# Patient Record
Sex: Male | Born: 1950 | Race: White | Hispanic: No | Smoking: Former smoker
Health system: Southern US, Community
[De-identification: ages and names within clinical notes are randomized; demographics above are authoritative.]

## PROBLEM LIST (undated history)

## (undated) DIAGNOSIS — K219 Gastro-esophageal reflux disease without esophagitis: Secondary | ICD-10-CM

## (undated) DIAGNOSIS — M7542 Impingement syndrome of left shoulder: Secondary | ICD-10-CM

## (undated) DIAGNOSIS — E119 Type 2 diabetes mellitus without complications: Secondary | ICD-10-CM

## (undated) DIAGNOSIS — N529 Male erectile dysfunction, unspecified: Secondary | ICD-10-CM

## (undated) DIAGNOSIS — K589 Irritable bowel syndrome without diarrhea: Secondary | ICD-10-CM

## (undated) DIAGNOSIS — E785 Hyperlipidemia, unspecified: Secondary | ICD-10-CM

## (undated) DIAGNOSIS — I10 Essential (primary) hypertension: Secondary | ICD-10-CM

## (undated) DIAGNOSIS — R200 Anesthesia of skin: Secondary | ICD-10-CM

## (undated) DIAGNOSIS — L280 Lichen simplex chronicus: Secondary | ICD-10-CM

## (undated) HISTORY — PX: COLONOSCOPY: SHX174

## (undated) HISTORY — PX: BACK SURGERY: SHX140

## (undated) HISTORY — PX: APPENDECTOMY: SHX54

## (undated) HISTORY — PX: CHOLECYSTECTOMY: SHX55

---

## 2003-12-29 ENCOUNTER — Ambulatory Visit: Payer: Self-pay | Admitting: Family Medicine

## 2004-08-03 ENCOUNTER — Ambulatory Visit: Payer: Self-pay | Admitting: Unknown Physician Specialty

## 2004-11-03 ENCOUNTER — Ambulatory Visit: Payer: Self-pay | Admitting: Unknown Physician Specialty

## 2007-04-30 ENCOUNTER — Ambulatory Visit: Payer: Self-pay | Admitting: Internal Medicine

## 2007-11-15 ENCOUNTER — Ambulatory Visit: Payer: Self-pay | Admitting: Unknown Physician Specialty

## 2009-01-06 ENCOUNTER — Ambulatory Visit: Payer: Self-pay | Admitting: Family Medicine

## 2010-11-28 ENCOUNTER — Ambulatory Visit: Payer: Self-pay | Admitting: Sports Medicine

## 2013-09-18 DIAGNOSIS — E785 Hyperlipidemia, unspecified: Secondary | ICD-10-CM | POA: Insufficient documentation

## 2013-09-18 DIAGNOSIS — E119 Type 2 diabetes mellitus without complications: Secondary | ICD-10-CM | POA: Insufficient documentation

## 2013-09-18 DIAGNOSIS — R200 Anesthesia of skin: Secondary | ICD-10-CM | POA: Insufficient documentation

## 2013-09-18 DIAGNOSIS — K589 Irritable bowel syndrome without diarrhea: Secondary | ICD-10-CM | POA: Insufficient documentation

## 2013-09-18 DIAGNOSIS — I10 Essential (primary) hypertension: Secondary | ICD-10-CM | POA: Insufficient documentation

## 2014-06-10 DIAGNOSIS — G8929 Other chronic pain: Secondary | ICD-10-CM | POA: Insufficient documentation

## 2014-06-10 DIAGNOSIS — M25512 Pain in left shoulder: Secondary | ICD-10-CM

## 2014-09-15 DIAGNOSIS — M7542 Impingement syndrome of left shoulder: Secondary | ICD-10-CM | POA: Insufficient documentation

## 2015-10-12 ENCOUNTER — Other Ambulatory Visit: Payer: Self-pay | Admitting: Gastroenterology

## 2015-10-12 DIAGNOSIS — R1312 Dysphagia, oropharyngeal phase: Secondary | ICD-10-CM

## 2015-10-13 ENCOUNTER — Other Ambulatory Visit
Admission: RE | Admit: 2015-10-13 | Discharge: 2015-10-13 | Disposition: A | Discharge status: Home / Self Care | Payer: BC Managed Care – PPO | Source: Ambulatory Visit | Attending: Gastroenterology | Admitting: Gastroenterology

## 2015-10-13 DIAGNOSIS — K580 Irritable bowel syndrome with diarrhea: Secondary | ICD-10-CM | POA: Insufficient documentation

## 2015-10-13 LAB — GASTROINTESTINAL PANEL BY PCR, STOOL (REPLACES STOOL CULTURE)
Adenovirus F40/41: NOT DETECTED
Astrovirus: NOT DETECTED
CAMPYLOBACTER SPECIES: NOT DETECTED
Cryptosporidium: NOT DETECTED
Cyclospora cayetanensis: NOT DETECTED
E. COLI O157: NOT DETECTED
ENTEROAGGREGATIVE E COLI (EAEC): NOT DETECTED
Entamoeba histolytica: NOT DETECTED
Enteropathogenic E coli (EPEC): NOT DETECTED
Enterotoxigenic E coli (ETEC): NOT DETECTED
GIARDIA LAMBLIA: NOT DETECTED
Norovirus GI/GII: NOT DETECTED
Plesimonas shigelloides: NOT DETECTED
Rotavirus A: NOT DETECTED
SALMONELLA SPECIES: NOT DETECTED
SHIGA LIKE TOXIN PRODUCING E COLI (STEC): NOT DETECTED
SHIGELLA/ENTEROINVASIVE E COLI (EIEC): NOT DETECTED
Sapovirus (I, II, IV, and V): NOT DETECTED
VIBRIO CHOLERAE: NOT DETECTED
VIBRIO SPECIES: NOT DETECTED
YERSINIA ENTEROCOLITICA: NOT DETECTED

## 2015-10-13 LAB — C DIFFICILE QUICK SCREEN W PCR REFLEX
C Diff antigen: NEGATIVE
C Diff interpretation: NOT DETECTED
C Diff toxin: NEGATIVE

## 2015-10-21 ENCOUNTER — Ambulatory Visit
Admission: RE | Admit: 2015-10-21 | Discharge: 2015-10-21 | Disposition: A | Discharge status: Home / Self Care | Payer: BC Managed Care – PPO | Source: Ambulatory Visit | Attending: Gastroenterology | Admitting: Gastroenterology

## 2015-10-21 DIAGNOSIS — R1312 Dysphagia, oropharyngeal phase: Secondary | ICD-10-CM | POA: Diagnosis not present

## 2015-10-21 NOTE — Therapy (Signed)
[place], [place], [address] Phone: [phone]   Fax:     Modified Barium Swallow  Patient Details  Name: Curtis Farmer MRN: [id number] Date of Birth: May 16, 1950 No Data Recorded  Encounter Date: 11-15-2015   Subjective: Patient behavior: (alertness, ability to follow instructions, etc.): Chief complaint: dysphagia   Objective:  Radiological Procedure: A videoflouroscopic evaluation of oral-preparatory, reflex initiation, and pharyngeal phases of the swallow was performed; as well as a screening of the upper esophageal phase.  I. POSTURE: upright II. VIEW: lateral III. COMPENSATORY STRATEGIES: NONE indicated IV. BOLUSES ADMINISTERED:  Thin Liquid: 5 trials  Nectar-thick Liquid: 1 trial  Honey-thick Liquid: NT  Puree: 3 trials  Mechanical Soft: 2 trials V. RESULTS OF EVALUATION: A. ORAL PREPARATORY PHASE: (The lips, tongue, and velum are observed for strength and coordination)       **Overall Severity Rating: WFL.   B. SWALLOW INITIATION/REFLEX: (The reflex is normal if "triggered" by the time the bolus reached the base of the tongue)  **Overall Severity Rating: WFL  C. PHARYNGEAL PHASE: (Pharyngeal function is normal if the bolus shows rapid, smooth, and continuous transit through the pharynx and there is no pharyngeal residue after the swallow)  **Overall Severity Rating: WFL  D. LARYNGEAL PENETRATION: (Material entering into the laryngeal inlet/vestibule but not aspirated): NONE E. ASPIRATION: NONE F. ESOPHAGEAL PHASE: (Screening of the upper esophagus): no apparent dysmotility noted in the upper, cervical Esophagus screened.  ASSESSMENT: Pt presented w/ an adequate oropharyngeal phase swallow w/ no deficits noted during this exam. Pt exhibited timely oral phase management and A-P transfer for swallowing; appropriate oral clearing occurred w/ all bolus trials given. Pt exhibited a  timely pharyngeal swallow initiation, and no pharyngeal residue remained post swallowing indicating adequate laryngeal excursion and pharyngeal pressure during the swallowing. No observed cervical Esophageal dysmotility noted during screening.  Pt was educated on general aspiration and Reflux precautions post exam.   PLAN/RECOMMENDATIONS:  A. Diet: regular diet; thin liquids  B. Swallowing Precautions: general aspiration and Reflux precautions  C. Recommended consultation to: continue to f/u w/ GI  D. Therapy recommendations: NONE  E. Results and recommendations were discussed w/ pt; video viewed post exam      End of Session - November 15, 2015 1512    Visit Number 1   Number of Visits 1   Date for SLP Re-Evaluation 15-Nov-2015   SLP Start Time 1250   SLP Stop Time  1350   SLP Time Calculation (min) 60 min   Activity Tolerance Patient tolerated treatment well      No past medical history on file.  No past surgical history on file.  There were no vitals filed for this visit.              Oropharyngeal dysphagia - Plan: DG SWALLOWING FUNC-SPEECH PATHOLOGY, DG SWALLOWING FUNC-SPEECH PATHOLOGY      G-Codes - Nov 15, 2015 1513    Functional Assessment Tool Used clinical judgement   Functional Limitations Swallowing   Swallow Current Status [id number] 0 percent impaired, limited or restricted   Swallow Goal Status [id number] 0 percent impaired, limited or restricted   Swallow Discharge Status [phone] 0 percent impaired, limited or restricted          Problem List There are no active problems to display for this patient.     Orinda Kenner, MS, CCC-SLP  , 11/15/15, 3:14 PM  [facility] [address]  [place], [place], [address] Phone: [phone]   Fax:     Name: Curtis Farmer MRN: [id number] Date of Birth: 1950/07/31

## 2016-01-10 ENCOUNTER — Encounter: Payer: Self-pay | Admitting: *Deleted

## 2016-01-11 ENCOUNTER — Encounter
Admission: RE | Disposition: A | Discharge status: Home / Self Care | Payer: Self-pay | Source: Ambulatory Visit | Attending: Gastroenterology

## 2016-01-11 ENCOUNTER — Encounter: Payer: Self-pay | Admitting: *Deleted

## 2016-01-11 ENCOUNTER — Ambulatory Visit: Payer: BC Managed Care – PPO | Admitting: Anesthesiology

## 2016-01-11 ENCOUNTER — Ambulatory Visit
Admission: RE | Admit: 2016-01-11 | Discharge: 2016-01-11 | Disposition: A | Discharge status: Home / Self Care | Payer: BC Managed Care – PPO | Source: Ambulatory Visit | Attending: Gastroenterology | Admitting: Gastroenterology

## 2016-01-11 DIAGNOSIS — Z7984 Long term (current) use of oral hypoglycemic drugs: Secondary | ICD-10-CM | POA: Insufficient documentation

## 2016-01-11 DIAGNOSIS — I10 Essential (primary) hypertension: Secondary | ICD-10-CM | POA: Diagnosis not present

## 2016-01-11 DIAGNOSIS — E119 Type 2 diabetes mellitus without complications: Secondary | ICD-10-CM | POA: Insufficient documentation

## 2016-01-11 DIAGNOSIS — K589 Irritable bowel syndrome without diarrhea: Secondary | ICD-10-CM | POA: Insufficient documentation

## 2016-01-11 DIAGNOSIS — K621 Rectal polyp: Secondary | ICD-10-CM | POA: Diagnosis not present

## 2016-01-11 DIAGNOSIS — Z79899 Other long term (current) drug therapy: Secondary | ICD-10-CM | POA: Insufficient documentation

## 2016-01-11 DIAGNOSIS — Z91018 Allergy to other foods: Secondary | ICD-10-CM | POA: Diagnosis not present

## 2016-01-11 DIAGNOSIS — Z86010 Personal history of colonic polyps: Secondary | ICD-10-CM | POA: Diagnosis present

## 2016-01-11 DIAGNOSIS — E785 Hyperlipidemia, unspecified: Secondary | ICD-10-CM | POA: Insufficient documentation

## 2016-01-11 DIAGNOSIS — Z1211 Encounter for screening for malignant neoplasm of colon: Secondary | ICD-10-CM | POA: Diagnosis not present

## 2016-01-11 DIAGNOSIS — D125 Benign neoplasm of sigmoid colon: Secondary | ICD-10-CM | POA: Insufficient documentation

## 2016-01-11 DIAGNOSIS — K5730 Diverticulosis of large intestine without perforation or abscess without bleeding: Secondary | ICD-10-CM | POA: Insufficient documentation

## 2016-01-11 DIAGNOSIS — Z87891 Personal history of nicotine dependence: Secondary | ICD-10-CM | POA: Insufficient documentation

## 2016-01-11 HISTORY — DX: Type 2 diabetes mellitus without complications: E11.9

## 2016-01-11 HISTORY — DX: Irritable bowel syndrome, unspecified: K58.9

## 2016-01-11 HISTORY — PX: COLONOSCOPY WITH PROPOFOL: SHX5780

## 2016-01-11 HISTORY — DX: Impingement syndrome of left shoulder: M75.42

## 2016-01-11 HISTORY — DX: Essential (primary) hypertension: I10

## 2016-01-11 HISTORY — DX: Anesthesia of skin: R20.0

## 2016-01-11 HISTORY — DX: Hyperlipidemia, unspecified: E78.5

## 2016-01-11 LAB — GLUCOSE, CAPILLARY: Glucose-Capillary: 111 mg/dL — ABNORMAL HIGH (ref 65–99)

## 2016-01-11 SURGERY — COLONOSCOPY WITH PROPOFOL
Anesthesia: General

## 2016-01-11 MED ORDER — FENTANYL CITRATE (PF) 100 MCG/2ML IJ SOLN
INTRAMUSCULAR | Status: DC | PRN
  Administered 2016-01-11: 50 ug via INTRAVENOUS

## 2016-01-11 MED ORDER — MIDAZOLAM HCL 2 MG/2ML IJ SOLN
INTRAMUSCULAR | Status: DC | PRN
  Administered 2016-01-11: 1 mg via INTRAVENOUS

## 2016-01-11 MED ORDER — SODIUM CHLORIDE 0.9 % IV SOLN
INTRAVENOUS | Status: DC
Start: 2016-01-11 — End: 2016-01-11

## 2016-01-11 MED ORDER — SODIUM CHLORIDE 0.9 % IV SOLN
INTRAVENOUS | Status: DC
Start: 2016-01-11 — End: 2016-01-11
  Administered 2016-01-11: 10:00:00 via INTRAVENOUS

## 2016-01-11 MED ORDER — PROPOFOL 500 MG/50ML IV EMUL
INTRAVENOUS | Status: DC | PRN
  Administered 2016-01-11: 120 ug/kg/min via INTRAVENOUS

## 2016-01-11 NOTE — H&P (Signed)
Outpatient short stay form Pre-procedure 01/11/2016 11:02 AM Lollie Sails MD  Primary Physician: Dr. Thereasa Distance  Reason for visit:  Colonoscopy  History of present illness:  Patient is a 65 year old male presenting today as above. He has a personal history of colon polyps with his last colonoscopy being done 12/19/2012. He also has issues with loose stool felt to be related to irritable bowel. He has been currently trialed on some colestipol which is improving the situation. He takes no aspirin products or blood thinning agents. He tolerated his prep well.    Current Facility-Administered Medications:  .  0.9 %  sodium chloride infusion, , Intravenous, Continuous, Lollie Sails, MD, Last Rate: 20 mL/hr at 01/11/16 1010 .  0.9 %  sodium chloride infusion, , Intravenous, Continuous, Lollie Sails, MD  Prescriptions Prior to Admission  Medication Sig Dispense Refill Last Dose  . albuterol (PROVENTIL HFA;VENTOLIN HFA) 108 (90 Base) MCG/ACT inhaler Inhale 2 puffs into the lungs every 6 (six) hours as needed for wheezing or shortness of breath.     . colestipol (COLESTID) 1 g tablet Take 1 g by mouth 2 (two) times daily.   01/09/2016  . hyoscyamine (LEVBID) 0.375 MG 12 hr tablet Take 0.375 mg by mouth 2 (two) times daily.     Marland Kitchen lisinopril (PRINIVIL,ZESTRIL) 20 MG tablet Take 20 mg by mouth daily.   01/09/2016  . metFORMIN (GLUCOPHAGE) 1000 MG tablet Take 1,000 mg by mouth 2 (two) times daily with a meal.   01/09/2016  . pioglitazone (ACTOS) 45 MG tablet Take 45 mg by mouth daily.   01/09/2016  . simvastatin (ZOCOR) 20 MG tablet Take 20 mg by mouth daily.   01/09/2016  . sitaGLIPtin (JANUVIA) 100 MG tablet Take 100 mg by mouth daily.   01/09/2016     Allergies  Allergen Reactions  . Pecan Nut (Diagnostic) Other (See Comments)  . Penicillin V Potassium Swelling     Past Medical History:  Diagnosis Date  . Diabetes mellitus without complication ([place])   . Hyperlipidemia    . Hypertension   . Impingement syndrome of shoulder, left   . Irritable bowel disease   . Numbness     Review of systems:      Physical Exam    Heart and lungs: Regular rate and rhythm without rub or gallop, lungs are bilaterally clear.    HEENT: Normocephalic atraumatic eyes are anicteric    Other:     Pertinant exam for procedure: Soft nontender nondistended bowel sounds positive normoactive.    Planned proceedures: Colonoscopy and indicated procedures. I have discussed the risks benefits and complications of procedures to include not limited to bleeding, infection, perforation and the risk of sedation and the patient wishes to proceed.    Lollie Sails, MD Gastroenterology 01/11/2016  11:02 AM

## 2016-01-11 NOTE — Transfer of Care (Signed)
Immediate Anesthesia Transfer of Care Note  Patient: Curtis Farmer  Procedure(s) Performed: Procedure(s): COLONOSCOPY WITH PROPOFOL (N/A)  Patient Location: PACU  Anesthesia Type:General  Level of Consciousness: awake and sedated  Airway & Oxygen Therapy: Patient Spontanous Breathing and Patient connected to face mask oxygen  Post-op Assessment: Report given to RN and Post -op Vital signs reviewed and stable  Post vital signs: Reviewed and stable  Last Vitals:  Vitals:   01/11/16 0950  BP: 140/70  Pulse: 79  Resp: 20  Temp: 36.3 C    Last Pain:  Vitals:   01/11/16 0950  TempSrc: Tympanic         Complications: No apparent anesthesia complications

## 2016-01-11 NOTE — Anesthesia Preprocedure Evaluation (Signed)
Anesthesia Evaluation  Patient identified by MRN, date of birth, ID band Patient awake    Reviewed: Allergy & Precautions, NPO status , Patient's Chart, lab work & pertinent test results  Airway Mallampati: II       Dental  (+) Teeth Intact   Pulmonary neg pulmonary ROS, former smoker,     + decreased breath sounds      Cardiovascular hypertension, Pt. on medications  Rhythm:Regular Rate:Normal     Neuro/Psych negative neurological ROS     GI/Hepatic negative GI ROS, Neg liver ROS,   Endo/Other  diabetes, Type 2Morbid obesity  Renal/GU negative Renal ROS     Musculoskeletal negative musculoskeletal ROS (+)   Abdominal (+) + obese,   Peds  Hematology   Anesthesia Other Findings   Reproductive/Obstetrics                             Anesthesia Physical Anesthesia Plan  ASA: III  Anesthesia Plan: General   Post-op Pain Management:    Induction: Intravenous  Airway Management Planned: Natural Airway and Nasal Cannula  Additional Equipment:   Intra-op Plan:   Post-operative Plan:   Informed Consent: I have reviewed the patients History and Physical, chart, labs and discussed the procedure including the risks, benefits and alternatives for the proposed anesthesia with the patient or authorized representative who has indicated his/her understanding and acceptance.     Plan Discussed with: CRNA  Anesthesia Plan Comments:         Anesthesia Quick Evaluation

## 2016-01-11 NOTE — Op Note (Signed)
[facility] Gastroenterology Patient Name: Curtis Farmer Procedure Date: 01/11/2016 11:05 AM MRN: [id number] Account #: [id number] Date of Birth: 13-Nov-1950 Admit Type: Outpatient Age: 65 Room: [facility] ENDO ROOM 3 Gender: Male Note Status: Finalized Procedure:            Colonoscopy Indications:          Personal history of colonic polyps Providers:            Lollie Sails, MD Referring MD:         Sofie Hartigan (Referring MD) Medicines:            Monitored Anesthesia Care Complications:        No immediate complications. Procedure:            Pre-Anesthesia Assessment:                       - ASA Grade Assessment: III - A patient with severe                        systemic disease.                       After obtaining informed consent, the colonoscope was                        passed under direct vision. Throughout the procedure,                        the patient's blood pressure, pulse, and oxygen                        saturations were monitored continuously. The                        Colonoscope was introduced through the anus and                        advanced to the the cecum, identified by appendiceal                        orifice and ileocecal valve. The colonoscopy was                        performed without difficulty. The patient tolerated the                        procedure well. The quality of the bowel preparation                        was good except the ascending colon was fair. Findings:      Five sessile polyps were found in the rectum. The polyps were 1 to 3 mm       in size. These polyps were removed with a cold biopsy forceps. Resection       and retrieval were complete.      Two sessile polyps were found in the sigmoid colon. The polyps were 1 to       2 mm in size. These polyps were removed with a cold biopsy forceps.       Resection and retrieval were complete.      A few small-mouthed  diverticula were found in the  sigmoid colon.      The retroflexed view of the distal rectum and anal verge was normal and       showed no anal or rectal abnormalities.      A 5 mm post polypectomy scar was found in the rectum. There was no       evidence of the previous polyp. Impression:           - Five 1 to 3 mm polyps in the rectum, removed with a                        cold biopsy forceps. Resected and retrieved.                       - Two 1 to 2 mm polyps in the sigmoid colon, removed                        with a cold biopsy forceps. Resected and retrieved.                       - Diverticulosis in the sigmoid colon.                       - The distal rectum and anal verge are normal on                        retroflexion view. Recommendation:       - Discharge patient to home.                       - Await pathology results.                       - Telephone GI clinic for pathology results in 1 week. Procedure Code(s):    --- Professional ---                       [phone], Colonoscopy, flexible; with biopsy, single or                        multiple Diagnosis Code(s):    --- Professional ---                       K62.1, Rectal polyp                       D12.5, Benign neoplasm of sigmoid colon                       Z86.010, Personal history of colonic polyps                       K57.30, Diverticulosis of large intestine without                        perforation or abscess without bleeding CPT copyright 2016 American Medical Association. All rights reserved. The codes documented in this report are preliminary and upon coder review may  be revised to meet current compliance requirements. Lollie Sails, MD 01/11/2016 11:40:08 AM This report has been signed electronically. Number of Addenda: 0 Note Initiated On: 01/11/2016 11:05 AM  Scope Withdrawal Time: 0 hours 16 minutes 3 seconds  Total Procedure Duration: 0 hours 24 minutes 59 seconds       [facility]

## 2016-01-11 NOTE — Anesthesia Postprocedure Evaluation (Signed)
Anesthesia Post Note  Patient: PRESTAN BRAND  Procedure(s) Performed: Procedure(s) (LRB): COLONOSCOPY WITH PROPOFOL (N/A)  Patient location during evaluation: PACU Anesthesia Type: General Level of consciousness: awake Pain management: pain level controlled Vital Signs Assessment: post-procedure vital signs reviewed and stable Respiratory status: spontaneous breathing Cardiovascular status: stable Anesthetic complications: no    Last Vitals:  Vitals:   01/11/16 1202 01/11/16 1212  BP: 113/75 130/71  Pulse: 87 76  Resp: 18 15  Temp:      Last Pain:  Vitals:   01/11/16 1142  TempSrc: Tympanic                 VAN STAVEREN,

## 2016-01-11 NOTE — Anesthesia Procedure Notes (Signed)
Performed by: Vaughan Sine Pre-anesthesia Checklist: Patient identified, Emergency Drugs available, Suction available, Patient being monitored and Timeout performed Patient Re-evaluated:Patient Re-evaluated prior to inductionOxygen Delivery Method: Non-rebreather mask Preoxygenation: Pre-oxygenation with 100% oxygen Intubation Type: IV induction Placement Confirmation: positive ETCO2 and CO2 detector

## 2016-01-12 ENCOUNTER — Encounter: Payer: Self-pay | Admitting: Gastroenterology

## 2016-01-12 LAB — SURGICAL PATHOLOGY

## 2018-03-18 ENCOUNTER — Encounter: Payer: Self-pay | Admitting: Urology

## 2018-03-18 ENCOUNTER — Ambulatory Visit: Payer: Medicare Other | Admitting: Urology

## 2018-03-18 VITALS — BP 125/62 | HR 85 | Ht 76.0 in | Wt 250.0 lb

## 2018-03-18 DIAGNOSIS — R972 Elevated prostate specific antigen [PSA]: Secondary | ICD-10-CM | POA: Diagnosis not present

## 2018-03-18 MED ORDER — DIAZEPAM 5 MG PO TABS: 5.0000 mg | ORAL_TABLET | Freq: Once | ORAL | 0 refills | Status: DC | PRN

## 2018-03-18 MED ORDER — TADALAFIL 20 MG PO TABS: 20.0000 mg | ORAL_TABLET | Freq: Every day | ORAL | 11 refills | Status: DC | PRN

## 2018-03-18 NOTE — Patient Instructions (Signed)

## 2018-03-18 NOTE — Progress Notes (Signed)
03/18/2018 11:07 AM   Curtis Farmer 30-Nov-1950 [id number]  Referring provider: Sofie Hartigan, MD [place], [place] [address]  CC: Elevated PSA  HPI: I saw Curtis Farmer in urology clinic today in consultation for elevated PSA from Dr. Ellison Hughs.  He is a 68 year old relatively healthy male with no family history of prostate cancer that was found to have an elevated PSA of 4.9 in September 2019.  This was rechecked in October 2019 and remained slightly elevated at 4.4.  He again underwent a recheck in December 2019 which was 6.93.  He has mild to moderate urinary symptoms with slow and weak stream with nocturia 1-2 times per night.  He is never tried medications for this.  He denies any gross hematuria, weight loss, bone pain, or flank pain.  He has a 40-pack-year smoking history and quit smoking 15 years ago.  There is no family history of breast cancer.  There are no aggravating or alleviating factors.  Severity is mild.  He has never undergone prior prostate biopsy.  He has moderate erectile dysfunction at baseline that is intermediately responsive to Cialis.  His past surgical history is notable for open appendectomy and a laparoscopic cholecystectomy.   PMH: Past Medical History:  Diagnosis Date  . Diabetes mellitus without complication ([place])   . Hyperlipidemia   . Hypertension   . Impingement syndrome of shoulder, left   . Irritable bowel disease   . Numbness     Surgical History: Past Surgical History:  Procedure Laterality Date  . APPENDECTOMY    . BACK SURGERY    . CHOLECYSTECTOMY    . COLONOSCOPY    . COLONOSCOPY WITH PROPOFOL N/A 01/11/2016   Procedure: COLONOSCOPY WITH PROPOFOL;  Surgeon: Lollie Sails, MD;  Location[facility] END[facility]COPY;  Service: Endoscopy;  Laterality: N/A;   Allergies:  Allergies  Allergen Reactions  . Pecan Nut (Diagnostic) Other (See Comments)  . Penicillin V Potassium Swelling    Family History: History reviewed. No  pertinent family history.  Social History:  reports that he has quit smoking. He has never used smokeless tobacco. He reports that he does not drink alcohol or use drugs.  ROS: Please see flowsheet from today's date for complete review of systems.  Physical Exam: BP 125/62   Pulse 85   Ht 6\' 4"  (1.93 m)   Wt 250 lb (113.4 kg)   BMI 30.43 kg/m    Constitutional:  Alert and oriented, No acute distress. Cardiovascular: No clubbing, cyanosis, or edema. Respiratory: Normal respiratory effort, no increased work of breathing. GI: Abdomen is soft, nontender, nondistended, no abdominal masses GU: No CVA tenderness DRE: 40 g, smooth, no nodules or masses Lymph: No cervical or inguinal lymphadenopathy. Skin: No rashes, bruises or suspicious lesions. Neurologic: Grossly intact, no focal deficits, moving all 4 extremities. Psychiatric: Normal mood and affect.  Laboratory Data: PSA history  01/2018: 6.93 11/2017: 4.4 10/2017: 4.93  Pertinent Imaging: None to review  Assessment & Plan:   In summary, the patient is a healthy 68 year old male with no family history of prostate cancer and elevated PSA of 6.9.  We reviewed the implications of an elevated PSA and the uncertainty surrounding it. In general, a man's PSA increases with age and is produced by both normal and cancerous prostate tissue. The differential diagnosis for elevated PSA includes BPH, prostate cancer, infection, recent intercourse/ejaculation, recent urethroscopic manipulation (foley placement/cystoscopy) or trauma, and prostatitis.   Management of an elevated PSA can include  observation or prostate biopsy and we discussed this in detail.  We also discussed the 4K score to stratify his risk prior to undergoing prostate biopsy.  Our goal is to detect clinically significant prostate cancers, and manage with either active surveillance, surgery, or radiation for localized disease. Risks of prostate biopsy include bleeding,  infection (including life threatening sepsis), pain, and lower urinary symptoms. Hematuria, hematospermia, and blood in the stool are all common after biopsy and can persist up to 4 weeks.   Schedule prostate biopsy Trial of Cialis 20 mg on demand, good [email or web address] coupon provided  [name], [place] [address], [place], [place] [address] [phone]

## 2018-04-04 ENCOUNTER — Encounter: Payer: Self-pay | Admitting: Urology

## 2018-04-04 ENCOUNTER — Ambulatory Visit (INDEPENDENT_AMBULATORY_CARE_PROVIDER_SITE_OTHER): Payer: Medicare Other | Admitting: Urology

## 2018-04-04 ENCOUNTER — Other Ambulatory Visit: Payer: Self-pay | Admitting: Urology

## 2018-04-04 VITALS — BP 117/70 | HR 65 | Ht 76.0 in | Wt 251.8 lb

## 2018-04-04 DIAGNOSIS — R972 Elevated prostate specific antigen [PSA]: Secondary | ICD-10-CM | POA: Diagnosis not present

## 2018-04-04 MED ORDER — GENTAMICIN SULFATE 40 MG/ML IJ SOLN
80.0000 mg | Freq: Once | INTRAMUSCULAR | Status: AC
  Administered 2018-04-04: 80 mg via INTRAMUSCULAR

## 2018-04-04 MED ORDER — LEVOFLOXACIN 500 MG PO TABS
500.0000 mg | ORAL_TABLET | Freq: Once | ORAL | Status: AC
  Administered 2018-04-04: 500 mg via ORAL

## 2018-04-04 NOTE — Progress Notes (Signed)
   04/04/18  Indication:   Prostate Biopsy Procedure   Informed consent was obtained, and we discussed the risks of bleeding and infection/sepsis. A time out was performed to ensure correct patient identity.  Pre-Procedure: - Last PSA Level: 6.93 - Gentamicin and levaquin given for antibiotic prophylaxis - Transrectal Ultrasound performed revealing a 64 gm prostate, PSA density 0.11 - No significant hypoechoic or median lobe noted  Procedure: - Prostate block performed using 10 cc 1% lidocaine and biopsies taken from sextant areas, a total of 12 under ultrasound guidance.  Post-Procedure: - Patient tolerated the procedure well - He was counseled to seek immediate medical attention if experiences significant bleeding, fevers, or severe pain - Return in one week to discuss biopsy results  Assessment/ Plan: Will follow up in 1-2 weeks to discuss pathology  [name], MD 04/04/2018

## 2018-04-07 ENCOUNTER — Emergency Department
Admission: EM | Admit: 2018-04-07 | Discharge: 2018-04-08 | Disposition: A | Discharge status: Home / Self Care | Payer: Medicare Other | Attending: Emergency Medicine | Admitting: Emergency Medicine

## 2018-04-07 ENCOUNTER — Other Ambulatory Visit: Payer: Self-pay

## 2018-04-07 DIAGNOSIS — I10 Essential (primary) hypertension: Secondary | ICD-10-CM | POA: Diagnosis not present

## 2018-04-07 DIAGNOSIS — E119 Type 2 diabetes mellitus without complications: Secondary | ICD-10-CM | POA: Insufficient documentation

## 2018-04-07 DIAGNOSIS — Z7984 Long term (current) use of oral hypoglycemic drugs: Secondary | ICD-10-CM | POA: Diagnosis not present

## 2018-04-07 DIAGNOSIS — Z87891 Personal history of nicotine dependence: Secondary | ICD-10-CM | POA: Diagnosis not present

## 2018-04-07 DIAGNOSIS — Z9049 Acquired absence of other specified parts of digestive tract: Secondary | ICD-10-CM | POA: Insufficient documentation

## 2018-04-07 DIAGNOSIS — K529 Noninfective gastroenteritis and colitis, unspecified: Secondary | ICD-10-CM | POA: Insufficient documentation

## 2018-04-07 DIAGNOSIS — Z79899 Other long term (current) drug therapy: Secondary | ICD-10-CM | POA: Diagnosis not present

## 2018-04-07 DIAGNOSIS — R112 Nausea with vomiting, unspecified: Secondary | ICD-10-CM | POA: Diagnosis present

## 2018-04-07 LAB — COMPREHENSIVE METABOLIC PANEL
ALT: 8 U/L (ref 0–44)
AST: 15 U/L (ref 15–41)
Albumin: 4.2 g/dL (ref 3.5–5.0)
Alkaline Phosphatase: 51 U/L (ref 38–126)
Anion gap: 7 (ref 5–15)
BUN: 24 mg/dL — AB (ref 8–23)
CO2: 26 mmol/L (ref 22–32)
Calcium: 9.8 mg/dL (ref 8.9–10.3)
Chloride: 104 mmol/L (ref 98–111)
Creatinine, Ser: 0.84 mg/dL (ref 0.61–1.24)
GFR calc Af Amer: >60 mL/min (ref >60)
GFR calc non Af Amer: >60 mL/min (ref >60)
Glucose, Bld: 221 mg/dL — ABNORMAL HIGH (ref 70–99)
Potassium: 5 mmol/L (ref 3.5–5.1)
Sodium: 137 mmol/L (ref 135–145)
Total Bilirubin: 1.3 mg/dL — ABNORMAL HIGH (ref 0.3–1.2)
Total Protein: 7.4 g/dL (ref 6.5–8.1)

## 2018-04-07 LAB — CBC
HEMATOCRIT: 51.4 % (ref 39.0–52.0)
Hemoglobin: 16.7 g/dL (ref 13.0–17.0)
MCH: 30.8 pg (ref 26.0–34.0)
MCHC: 32.5 g/dL (ref 30.0–36.0)
MCV: 94.8 fL (ref 80.0–100.0)
Platelets: 258 10*3/uL (ref 150–400)
RBC: 5.42 MIL/uL (ref 4.22–5.81)
RDW: 13.4 % (ref 11.5–15.5)
WBC: 9.6 10*3/uL (ref 4.0–10.5)
nRBC: 0 % (ref 0.0–0.2)

## 2018-04-07 LAB — URINALYSIS, COMPLETE (UACMP) WITH MICROSCOPIC
Bacteria, UA: NONE SEEN
Bilirubin Urine: NEGATIVE
Ketones, ur: 5 mg/dL — AB
Leukocytes, UA: NEGATIVE
Nitrite: NEGATIVE
PROTEIN: NEGATIVE mg/dL
RBC / HPF: >50 RBC/hpf — ABNORMAL HIGH (ref 0–5)
Specific Gravity, Urine: 1.029 (ref 1.005–1.030)
pH: 5 (ref 5.0–8.0)

## 2018-04-07 LAB — LIPASE, BLOOD: Lipase: 37 U/L (ref 11–51)

## 2018-04-07 LAB — TROPONIN I: Troponin I: <0.03 ng/mL (ref <0.03)

## 2018-04-07 NOTE — ED Triage Notes (Signed)
Pt with generalized abd pain for 24 hours with diarrhea, vomiting. Pt complains of dizziness and weakness. Pt had a prostate biopsy on Thursday. Pt with pwd skin.

## 2018-04-08 MED ORDER — DICYCLOMINE HCL 10 MG PO CAPS
10.0000 mg | ORAL_CAPSULE | Freq: Once | ORAL | Status: AC
  Administered 2018-04-08: 10 mg via ORAL
  Filled 2018-04-08: qty 1

## 2018-04-08 MED ORDER — ONDANSETRON 8 MG PO TBDP: 8.0000 mg | ORAL_TABLET | Freq: Three times a day (TID) | ORAL | 0 refills | Status: AC | PRN

## 2018-04-08 MED ORDER — DICYCLOMINE HCL 10 MG PO CAPS: 10.0000 mg | ORAL_CAPSULE | Freq: Three times a day (TID) | ORAL | 0 refills | Status: DC | PRN

## 2018-04-08 MED ORDER — SODIUM CHLORIDE 0.9 % IV BOLUS
1000.0000 mL | Freq: Once | INTRAVENOUS | Status: AC
  Administered 2018-04-08: 1000 mL via INTRAVENOUS

## 2018-04-08 MED ORDER — ONDANSETRON HCL 4 MG/2ML IJ SOLN
4.0000 mg | Freq: Once | INTRAMUSCULAR | Status: AC
  Administered 2018-04-08: 4 mg via INTRAVENOUS
  Filled 2018-04-08: qty 2

## 2018-04-08 NOTE — ED Provider Notes (Signed)
[facility] Emergency Department Provider Note ____________________________________________   First MD Initiated Contact with Patient 04/07/18 2354     (approximate)  I have reviewed the triage vital signs and the nursing notes.   HISTORY  Chief Complaint Emesis; Diarrhea; Weakness; and Abdominal Pain    HPI Curtis Farmer is a 68 y.o. male with PMH as noted below who presents with nausea, vomiting, and diarrhea over the last day, associated with inability to tolerate p.o. and generalized weakness.  He also reports some crampy intermittent abdominal pain.  She denies fever.  The patient states that he had a prostate biopsy 4 days ago, but states it was uncomplicated and he has not had any blood in the urine, dysuria, or other symptoms related to this.  He denies any travel, unusual foods, or sick contacts.   Past Medical History:  Diagnosis Date  . Diabetes mellitus without complication ([place])   . Hyperlipidemia   . Hypertension   . Impingement syndrome of shoulder, left   . Irritable bowel disease   . Numbness     Patient Active Problem List   Diagnosis Date Noted  . Impingement syndrome of left shoulder 09/15/2014  . Chronic left shoulder pain 06/10/2014  . Diabetes mellitus type 2, uncomplicated ([place]) 00/92/3300  . Hyperlipidemia 09/18/2013  . Hypertension 09/18/2013  . Irritable bowel 09/18/2013    Past Surgical History:  Procedure Laterality Date  . APPENDECTOMY    . BACK SURGERY    . CHOLECYSTECTOMY    . COLONOSCOPY    . COLONOSCOPY WITH PROPOFOL N/A 01/11/2016   Procedure: COLONOSCOPY WITH PROPOFOL;  Surgeon: Lollie Sails, MD;  Location: [facility] ENDOSCOPY;  Service: Endoscopy;  Laterality: N/A;    Prior to Admission medications   Medication Sig Start Date End Date Taking? Authorizing Provider  albuterol (PROVENTIL HFA;VENTOLIN HFA) 108 (90 Base) MCG/ACT inhaler Inhale 2 puffs into the lungs every 6 (six) hours as needed for  wheezing or shortness of breath.    [provider]  colestipol (COLESTID) 1 g tablet Take 1 g by mouth 2 (two) times daily.    [provider]  diazepam (VALIUM) 5 MG tablet Take 1 tablet (5 mg total) by mouth once as needed for up to 1 dose for anxiety. Take 30 minutes prior to prostate biopsy 03/18/18   [name], MD  dicyclomine (BENTYL) 10 MG capsule Take 1 capsule (10 mg total) by mouth 3 (three) times daily as needed for up to 5 days for spasms. 04/08/18 04/13/18  Arta Silence, MD  hyoscyamine (LEVBID) 0.375 MG 12 hr tablet Take 0.375 mg by mouth 2 (two) times daily.    [provider]  lisinopril (PRINIVIL,ZESTRIL) 20 MG tablet Take 20 mg by mouth daily.    [provider]  metFORMIN (GLUCOPHAGE) 1000 MG tablet Take 1,000 mg by mouth 2 (two) times daily with a meal.    [provider]  ondansetron (ZOFRAN ODT) 8 MG disintegrating tablet Take 1 tablet (8 mg total) by mouth every 8 (eight) hours as needed for up to 5 days for nausea or vomiting. 04/08/18 04/13/18  Arta Silence, MD  pioglitazone (ACTOS) 45 MG tablet Take 45 mg by mouth daily.    [provider]  simvastatin (ZOCOR) 20 MG tablet Take 20 mg by mouth daily.    [provider]  sitaGLIPtin (JANUVIA) 100 MG tablet Take 100 mg by mouth daily.    [provider]  tadalafil (ADCIRCA/CIALIS) 20  MG tablet Take 1 tablet (20 mg total) by mouth daily as needed for erectile dysfunction. 03/18/18   [name], MD    Allergies Pecan nut (diagnostic) and Penicillin v potassium  No family history on file.  Social History Social History   Tobacco Use  . Smoking status: Former Research scientist (life sciences)  . Smokeless tobacco: Never Used  Substance Use Topics  . Alcohol use: No  . Drug use: No    Review of Systems  Constitutional: No fever.  Positive for fatigue. Eyes: No redness. ENT: No sore throat. Cardiovascular: Denies chest pain. Respiratory: Denies  shortness of breath. Gastrointestinal: Positive for vomiting and diarrhea.  Genitourinary: Negative for dysuria or hematuria.  Musculoskeletal: Negative for back pain. Skin: Negative for rash. Neurological: Negative for headache.   ____________________________________________   PHYSICAL EXAM:  VITAL SIGNS: ED Triage Vitals  Enc Vitals Group     BP 04/07/18 2218 125/71     Pulse Rate 04/07/18 2218 (!) 104     Resp 04/07/18 2218 16     Temp 04/07/18 2218 98.1 F (36.7 C)     Temp Source 04/07/18 2218 Oral     SpO2 04/07/18 2218 100 %     Weight 04/07/18 2219 250 lb (113.4 kg)     Height 04/07/18 2219 6\' 4"  (1.93 m)     Head Circumference --      Peak Flow --      Pain Score 04/07/18 2219 7     Pain Loc --      Pain Edu? --      Excl. in [place]? --     Constitutional: Alert and oriented.  Relatively well appearing and in no acute distress. Eyes: Conjunctivae are normal.  Head: Atraumatic. Nose: No congestion/rhinnorhea. Mouth/Throat: Mucous membranes are somewhat dry.   Neck: Normal range of motion.  Cardiovascular: Good peripheral circulation. Respiratory: Normal respiratory effort.   Gastrointestinal: Soft and nontender. No distention.  Genitourinary: No flank tenderness. Musculoskeletal:  Extremities warm and well perfused.  Neurologic:  Normal speech and language. No gross focal neurologic deficits are appreciated.  Skin:  Skin is warm and dry. No rash noted. Psychiatric: Mood and affect are normal. Speech and behavior are normal.  ____________________________________________   LABS (all labs ordered are listed, but only abnormal results are displayed)  Labs Reviewed  COMPREHENSIVE METABOLIC PANEL - Abnormal; Notable for the following components:      Result Value   Glucose, Bld 221 (*)    BUN 24 (*)    Total Bilirubin 1.3 (*)    All other components within normal limits  URINALYSIS, COMPLETE (UACMP) WITH MICROSCOPIC - Abnormal; Notable for the following  components:   Color, Urine YELLOW (*)    APPearance CLEAR (*)    Glucose, UA >=500 (*)    Hgb urine dipstick MODERATE (*)    Ketones, ur 5 (*)    RBC / HPF >50 (*)    All other components within normal limits  LIPASE, BLOOD  CBC  TROPONIN I   ____________________________________________  EKG  ED ECG REPORT I, Arta Silence, the attending physician, personally viewed and interpreted this ECG.  Date: 04/08/2018 EKG Time: 2220 Rate: 97 Rhythm: normal sinus rhythm QRS Axis: normal Intervals: normal ST/T Wave abnormalities: normal Narrative Interpretation: no evidence of acute ischemia  ____________________________________________  RADIOLOGY    ____________________________________________   PROCEDURES  Procedure(s) performed: No  Procedures  Critical Care performed: No ____________________________________________   INITIAL IMPRESSION / ASSESSMENT AND PLAN /  ED COURSE  Pertinent labs & imaging results that were available during my care of the patient were reviewed by me and considered in my medical decision making (see chart for details).  68 year old male with PMH as noted above presents with nausea, vomiting, and diarrhea as well as decreased p.o. intake and fatigue over the last 24 hours.  The patient had an outpatient prostate biopsy several days ago and I reviewed these records in [place].  However there were no complications.  The patient has had no dysuria, hematuria, suprapubic or rectal pain, or other symptoms related to this.  On exam the patient is overall well-appearing.  He was slightly tachycardic in triage.  His other vital signs are normal.  The abdomen is soft with no focal tenderness.  Overall the presentation is consistent with viral gastroenteritis versus foodborne illness.  We will obtain labs, give fluids and some Zofran and Bentyl for symptomatic treatment and reassess.  At this time there is no indication for imaging.  I anticipate discharge  home.  ----------------------------------------- 1:28 AM on 04/08/2018 -----------------------------------------  The patient is feeling much better after the medications and fluids.  His lab work-up is unremarkable.  RBCs on the UA are consistent with the recent prostate biopsy but there is no evidence of infection.  He is stable for discharge home at this time.  Return precautions given, and he expresses understanding. ____________________________________________   FINAL CLINICAL IMPRESSION(S) / ED DIAGNOSES  Final diagnoses:  Gastroenteritis      NEW MEDICATIONS STARTED DURING THIS VISIT:  New Prescriptions   DICYCLOMINE (BENTYL) 10 MG CAPSULE    Take 1 capsule (10 mg total) by mouth 3 (three) times daily as needed for up to 5 days for spasms.   ONDANSETRON (ZOFRAN ODT) 8 MG DISINTEGRATING TABLET    Take 1 tablet (8 mg total) by mouth every 8 (eight) hours as needed for up to 5 days for nausea or vomiting.     Note:  This document was prepared using Dragon voice recognition software and may include unintentional dictation errors.    Arta Silence, MD 04/08/18 [phone]

## 2018-04-08 NOTE — Discharge Instructions (Signed)
We have prescribed some medicine for the nausea and abdominal cramping although you should only take these if you still have symptoms tomorrow.  If you are feeling better, you do not need to take them.  Return to the ER for new, worsening, persistent vomiting, weakness, fever, persistent abdominal pain, or any other new or worsening symptoms that concern you.

## 2018-04-10 ENCOUNTER — Other Ambulatory Visit: Payer: Self-pay | Admitting: Urology

## 2018-04-10 ENCOUNTER — Telehealth: Payer: Self-pay

## 2018-04-10 LAB — PATHOLOGY REPORT

## 2018-04-10 NOTE — Telephone Encounter (Signed)
-----   Message from [name], MD sent at 04/10/2018  4:19 PM EST ----- His biopsy was all negative and showed no evidence of prostate cancer.  Keep scheduled follow-up to review details, but I did not want him to worry for the next 10 days while waiting for the appointment.  [name], MD 04/10/2018

## 2018-04-10 NOTE — Telephone Encounter (Signed)
Called pt informed him of the information below. Pt gave verbal understanding.  

## 2018-04-23 ENCOUNTER — Encounter: Payer: Self-pay | Admitting: Urology

## 2018-04-23 ENCOUNTER — Ambulatory Visit (INDEPENDENT_AMBULATORY_CARE_PROVIDER_SITE_OTHER): Payer: Medicare Other | Admitting: Urology

## 2018-04-23 VITALS — BP 147/77 | HR 84 | Ht 76.0 in | Wt 250.0 lb

## 2018-04-23 DIAGNOSIS — Z125 Encounter for screening for malignant neoplasm of prostate: Secondary | ICD-10-CM | POA: Diagnosis not present

## 2018-04-23 NOTE — Patient Instructions (Signed)

## 2018-04-23 NOTE — Progress Notes (Signed)
   04/23/2018 11:32 AM   Curtis Farmer Jun 01, 1950 [id number]  Reason for visit: Follow up prostate biopsy results  HPI: I saw [name] in follow-up today to discuss his prostate biopsy results.  To briefly summarize, he is a healthy 68 year old male with no family history of prostate cancer who was found to have an elevated PSA of 6.9, and underwent a prostate biopsy on 04/04/2018.  This revealed a 64 g prostate with a PSA density of 0.11, and showed only benign prostatic tissue.  He is recovered well from his biopsy and is doing well.  He has minimal urinary symptoms at baseline.  We had a long conversation about PSA screening and approximately 20% false negative rate of a prostate biopsy.  We discussed the need for ongoing PSA screening up to age 97 per the AUA guidelines.  He will have a PSA drawn with his primary care physician at a visit this summer, and forward the results to [place].  If this is stable, we will follow-up in 1 year with repeat PSA.  Obtain PSA this summer with PCP, forward results for our review RTC 1 year with PSA  A total of 15 minutes were spent face-to-face with the patient, greater than 50% was spent in patient education, counseling, and coordination of care regarding negative prostate biopsy results and PSA screening.  [name], [facility] [address], [place], [place] [address] [phone]

## 2018-09-04 ENCOUNTER — Other Ambulatory Visit: Payer: Self-pay

## 2018-09-05 ENCOUNTER — Telehealth: Payer: Self-pay

## 2018-09-05 NOTE — Telephone Encounter (Signed)
Please let him know PSA down a bit and stable from prior, good news. It was 5.2 from 6.9 six months ago when he had a negative prostate biopsy. Keep scheduled follow up in 03/2018.  Thanks [name], MD 09/05/2018   Patient notified

## 2018-09-05 NOTE — Telephone Encounter (Signed)
-----   Message from [name], MD sent at 09/05/2018  7:41 AM EDT -----   ----- Message ----- From: Delon [place] D Sent: 09/04/2018   9:59 AM EDT To: [name], MD

## 2018-09-05 NOTE — Progress Notes (Signed)
Please let him know PSA down a bit and stable from prior, good news. It was 5.2 from 6.9 six months ago when he had a negative prostate biopsy. Keep scheduled follow up in 03/2018.  Thanks [name], MD 09/05/2018

## 2018-10-09 ENCOUNTER — Other Ambulatory Visit: Payer: Self-pay | Admitting: Orthopedic Surgery

## 2018-10-09 DIAGNOSIS — M25461 Effusion, right knee: Secondary | ICD-10-CM

## 2018-10-09 DIAGNOSIS — M25361 Other instability, right knee: Secondary | ICD-10-CM

## 2018-10-21 ENCOUNTER — Other Ambulatory Visit: Payer: Self-pay

## 2018-10-21 ENCOUNTER — Ambulatory Visit
Admission: RE | Admit: 2018-10-21 | Discharge: 2018-10-21 | Disposition: A | Discharge status: Home / Self Care | Payer: Medicare Other | Source: Ambulatory Visit | Attending: Orthopedic Surgery | Admitting: Orthopedic Surgery

## 2018-10-21 DIAGNOSIS — M25461 Effusion, right knee: Secondary | ICD-10-CM | POA: Diagnosis present

## 2018-10-21 DIAGNOSIS — M25361 Other instability, right knee: Secondary | ICD-10-CM | POA: Diagnosis present

## 2019-04-18 ENCOUNTER — Other Ambulatory Visit: Payer: Medicare Other

## 2019-04-22 ENCOUNTER — Other Ambulatory Visit
Admission: RE | Admit: 2019-04-22 | Discharge: 2019-04-22 | Disposition: A | Discharge status: Home / Self Care | Payer: Medicare PPO | Attending: Urology | Admitting: Urology

## 2019-04-22 ENCOUNTER — Ambulatory Visit: Payer: Medicare Other | Admitting: Urology

## 2019-04-22 ENCOUNTER — Other Ambulatory Visit: Payer: Self-pay

## 2019-04-22 DIAGNOSIS — R972 Elevated prostate specific antigen [PSA]: Secondary | ICD-10-CM | POA: Diagnosis present

## 2019-04-22 LAB — PSA: Prostatic Specific Antigen: 4.77 ng/mL — ABNORMAL HIGH (ref 0.00–4.00)

## 2019-04-24 ENCOUNTER — Ambulatory Visit: Payer: Medicare Other | Admitting: Urology

## 2019-04-29 ENCOUNTER — Ambulatory Visit: Payer: Medicare PPO | Admitting: Urology

## 2019-04-29 ENCOUNTER — Other Ambulatory Visit: Payer: Self-pay

## 2019-04-29 ENCOUNTER — Encounter: Payer: Self-pay | Admitting: Urology

## 2019-04-29 VITALS — BP 150/87 | HR 67 | Ht 75.0 in | Wt 271.0 lb

## 2019-04-29 DIAGNOSIS — N529 Male erectile dysfunction, unspecified: Secondary | ICD-10-CM | POA: Diagnosis not present

## 2019-04-29 DIAGNOSIS — Z125 Encounter for screening for malignant neoplasm of prostate: Secondary | ICD-10-CM

## 2019-04-29 MED ORDER — TADALAFIL 20 MG PO TABS: 20.0000 mg | ORAL_TABLET | Freq: Every day | ORAL | 11 refills | Status: DC | PRN

## 2019-04-29 NOTE — Patient Instructions (Signed)
Tadalafil tablets (Cialis) What is this medicine? TADALAFIL (tah DA la fil) is used to treat erection problems in men. It is also used for enlargement of the prostate gland in men, a condition called benign prostatic hyperplasia or BPH. This medicine improves urine flow and reduces BPH symptoms. This medicine can also treat both erection problems and BPH when they occur together. This medicine may be used for other purposes; ask your health care provider or pharmacist if you have questions. COMMON BRAND NAME(S): Adcirca, ALYQ, Cialis What should I tell my health care provider before I take this medicine? They need to know if you have any of these conditions:  bleeding disorders  eye or vision problems, including a rare inherited eye disease called retinitis pigmentosa  anatomical deformation of the penis, Peyronie's disease, or history of priapism (painful and prolonged erection)  heart disease, angina, a history of heart attack, irregular heart beats, or other heart problems  high or low blood pressure  history of blood diseases, like sickle cell anemia or leukemia  history of stomach bleeding  kidney disease  liver disease  stroke  an unusual or allergic reaction to tadalafil, other medicines, foods, dyes, or preservatives  pregnant or trying to get pregnant  breast-feeding How should I use this medicine? Take this medicine by mouth with a glass of water. Follow the directions on the prescription label. You may take this medicine with or without meals. When this medicine is used for erection problems, your doctor may prescribe it to be taken once daily or as needed. If you are taking the medicine as needed, you may be able to have sexual activity 30 minutes after taking it and for up to 36 hours after taking it. Whether you are taking the medicine as needed or once daily, you should not take more than one dose per day. If you are taking this medicine for symptoms of benign  prostatic hyperplasia (BPH) or to treat both BPH and an erection problem, take the dose once daily at about the same time each day. Do not take your medicine more often than directed. Talk to your pediatrician regarding the use of this medicine in children. Special care may be needed. Overdosage: If you think you have taken too much of this medicine contact a poison control center or emergency room at once. NOTE: This medicine is only for you. Do not share this medicine with others. What if I miss a dose? If you are taking this medicine as needed for erection problems, this does not apply. If you miss a dose while taking this medicine once daily for an erection problem, benign prostatic hyperplasia, or both, take it as soon as you remember, but do not take more than one dose per day. What may interact with this medicine? Do not take this medicine with any of the following medications:  nitrates like amyl nitrite, isosorbide dinitrate, isosorbide mononitrate, nitroglycerin  other medicines for erectile dysfunction like avanafil, sildenafil, vardenafil  other tadalafil products (Adcirca)  riociguat This medicine may also interact with the following medications:  certain drugs for high blood pressure  certain drugs for the treatment of HIV infection or AIDS  certain drugs used for fungal or yeast infections, like fluconazole, itraconazole, ketoconazole, and voriconazole  certain drugs used for seizures like carbamazepine, phenytoin, and phenobarbital  grapefruit juice  macrolide antibiotics like clarithromycin, erythromycin, troleandomycin  medicines for prostate problems  rifabutin, rifampin or rifapentine This list may not describe all possible interactions. Give your   health care provider a list of all the medicines, herbs, non-prescription drugs, or dietary supplements you use. Also tell them if you smoke, drink alcohol, or use illegal drugs. Some items may interact with your  medicine. What should I watch for while using this medicine? If you notice any changes in your vision while taking this drug, call your doctor or health care professional as soon as possible. Stop using this medicine and call your health care provider right away if you have a loss of sight in one or both eyes. Contact your doctor or health care professional right away if the erection lasts longer than 4 hours or if it becomes painful. This may be a sign of serious problem and must be treated right away to prevent permanent damage. If you experience symptoms of nausea, dizziness, chest pain or arm pain upon initiation of sexual activity after taking this medicine, you should refrain from further activity and call your doctor or health care professional as soon as possible. Do not drink alcohol to excess (examples, 5 glasses of wine or 5 shots of whiskey) when taking this medicine. When taken in excess, alcohol can increase your chances of getting a headache or getting dizzy, increasing your heart rate or lowering your blood pressure. Using this medicine does not protect you or your partner against HIV infection (the virus that causes AIDS) or other sexually transmitted diseases. What side effects may I notice from receiving this medicine? Side effects that you should report to your doctor or health care professional as soon as possible:  allergic reactions like skin rash, itching or hives, swelling of the face, lips, or tongue  breathing problems  changes in hearing  changes in vision  chest pain  fast, irregular heartbeat  prolonged or painful erection  seizures Side effects that usually do not require medical attention (report to your doctor or health care professional if they continue or are bothersome):  back pain  dizziness  flushing  headache  indigestion  muscle aches  nausea  stuffy or runny nose This list may not describe all possible side effects. Call your doctor  for medical advice about side effects. You may report side effects to FDA at 1-800-FDA-1088. Where should I keep my medicine? Keep out of the reach of children. Store at room temperature between 15 and 30 degrees C (59 and 86 degrees F). Throw away any unused medicine after the expiration date. NOTE: This sheet is a summary. It may not cover all possible information. If you have questions about this medicine, talk to your doctor, pharmacist, or health care provider.  2020 Elsevier/Gold Standard (2013-07-04 13:15:49)   Prostate Cancer Screening  Prostate cancer screening is a test that is done to check for the presence of prostate cancer in men. The prostate gland is a walnut-sized gland that is located below the bladder and in front of the rectum in males. The function of the prostate is to add fluid to semen during ejaculation. Prostate cancer is the second most common type of cancer in men. Who should have prostate cancer screening?  Screening recommendations vary based on age and other risk factors. Screening is recommended if:  You are older than age 55. If you are age 55-69, talk with your health care provider about your need for screening and how often screening should be done. Because most prostate cancers are slow growing and will not cause death, screening is generally reserved in this age group for men who have a 10-15-year   life expectancy.  You are younger than age 55, and you have these risk factors: ? Being a black male or a male of African descent. ? Having a father, brother, or uncle who has been diagnosed with prostate cancer. The risk is higher if your family member's cancer occurred at an early age. Screening is not recommended if:  You are younger than age 40.  You are between the ages of 40 and 54 and you have no risk factors.  You are 70 years of age or older. At this age, the risks that screening can cause are greater than the benefits that it may provide. If you are  at high risk for prostate cancer, your health care provider may recommend that you have screenings more often or that you start screening at a younger age. How is screening for prostate cancer done? The recommended prostate cancer screening test is a blood test called the prostate-specific antigen (PSA) test. PSA is a protein that is made in the prostate. As you age, your prostate naturally produces more PSA. Abnormally high PSA levels may be caused by:  Prostate cancer.  An enlarged prostate that is not caused by cancer (benign prostatic hyperplasia, BPH). This condition is very common in older men.  A prostate gland infection (prostatitis). Depending on the PSA results, you may need more tests, such as:  A physical exam to check the size of your prostate gland.  Blood and imaging tests.  A procedure to remove tissue samples from your prostate gland for testing (biopsy). What are the benefits of prostate cancer screening?  Screening can help to identify cancer at an early stage, before symptoms start and when the cancer can be treated more easily.  There is a small chance that screening may lower your risk of dying from prostate cancer. The chance is small because prostate cancer is a slow-growing cancer, and most men with prostate cancer die from a different cause. What are the risks of prostate cancer screening? The main risk of prostate cancer screening is diagnosing and treating prostate cancer that would never have caused any symptoms or problems. This is called overdiagnosisand overtreatment. PSA screening cannot tell you if your PSA is high due to cancer or a different cause. A prostate biopsy is the only procedure to diagnose prostate cancer. Even the results of a biopsy may not tell you if your cancer needs to be treated. Slow-growing prostate cancer may not need any treatment other than monitoring, so diagnosing and treating it may cause unnecessary stress or other side effects. A  prostate biopsy may also cause:  Infection or fever.  A false negative. This is a result that shows that you do not have prostate cancer when you actually do have prostate cancer. Questions to ask your health care provider  When should I start prostate cancer screening?  What is my risk for prostate cancer?  How often do I need screening?  What type of screening tests do I need?  How do I get my test results?  What do my results mean?  Do I need treatment? Where to find more information  The American Cancer Society: www.cancer.org  American Urological Association: www.auanet.org Contact a health care provider if:  You have difficulty urinating.  You have pain when you urinate or ejaculate.  You have blood in your urine or semen.  You have pain in your back or in the area of your prostate. Summary  Prostate cancer is a common type of cancer   in men. The prostate gland is located below the bladder and in front of the rectum. This gland adds fluid to semen during ejaculation.  Prostate cancer screening may identify cancer at an early stage, when the cancer can be treated more easily.  The prostate-specific antigen (PSA) test is the recommended screening test for prostate cancer.  Discuss the risks and benefits of prostate cancer screening with your health care provider. If you are age 70 or older, the risks that screening can cause are greater than the benefits that it may provide. This information is not intended to replace advice given to you by your health care provider. Make sure you discuss any questions you have with your health care provider. Document Revised: 09/26/2018 Document Reviewed: 09/26/2018 Elsevier Patient Education  2020 Elsevier Inc.  

## 2019-04-29 NOTE — Progress Notes (Signed)
   04/29/2019 9:11 AM   LEGACY RAFFETTO 05/31/1950 [id number]  Reason for visit: Follow up elevated PSA, ED  HPI: I saw Mr. Gochez in follow-up today for history of elevated PSA and ED. To briefly summarize, he is a healthy 69 year old male with no family history of prostate cancer who was found to have an elevated PSA of 6.9, and underwent a prostate biopsy on 04/04/2018.  This revealed a 64 g prostate with a PSA density of 0.11, and showed only benign prostatic tissue.  Follow-up PSA in June 2020 was stable at 5.2. Most recent PSA 04/22/19 is slightly lower at 4.77.  He denies any complaints since we last saw him and feels he is doing very well.  He denies any significant urinary symptoms.  He continues to use 20 mg Cialis on demand for ED with excellent results.  He would like to continue PSA screening with his PCP.  Physical Exam: BP (!) 150/87 (BP Location: Left Arm, Patient Position: Sitting, Cuff Size: Large)   Pulse 67   Ht 6\' 3"  (1.905 m)   Wt 271 lb (122.9 kg)   BMI 33.87 kg/m   Constitutional:  Alert and oriented, No acute distress  Laboratory Data: PSA history 04/22/2019: 4.77 08/27/2018: 5.2 01/2018: 6.93 10/2017: 4.9  Assessment & Plan:   In summary, he is a 69 year old male with a history of a negative prostate biopsy in February 2020 for an elevated PSA of 6.9.  This showed a 64 g prostate with a reassuring PSA density and only benign tissue.  PSA is continued to downtrend over the last year, most recently 4.77 in February 2021. ED well controlled on Cialis, risks and benefits of PDE5 inhibitors reviewed.  We again reviewed the AUA guidelines that recommend routine yearly screening in men age 42-69.  We discussed the risks and benefits of PSA screening at length.  We reviewed that his PSA trend and history of negative biopsy are all very reassuring.  He would like to follow-up with his PCP for yearly PSA and Cialis refills, and will be referred back to [place] if the PSA trend  rises.  Cialis refilled, good Rx coupon provided Continue yearly PSA screening with PCP  [name], [place] [address], [place], [place] [address] [phone]

## 2020-12-01 ENCOUNTER — Encounter: Payer: Self-pay | Admitting: *Deleted

## 2020-12-01 ENCOUNTER — Emergency Department: Payer: Medicare PPO

## 2020-12-01 ENCOUNTER — Other Ambulatory Visit: Payer: Self-pay

## 2020-12-01 DIAGNOSIS — Z20822 Contact with and (suspected) exposure to covid-19: Secondary | ICD-10-CM | POA: Insufficient documentation

## 2020-12-01 DIAGNOSIS — R112 Nausea with vomiting, unspecified: Secondary | ICD-10-CM | POA: Diagnosis not present

## 2020-12-01 DIAGNOSIS — Z79899 Other long term (current) drug therapy: Secondary | ICD-10-CM | POA: Diagnosis not present

## 2020-12-01 DIAGNOSIS — R197 Diarrhea, unspecified: Secondary | ICD-10-CM | POA: Insufficient documentation

## 2020-12-01 DIAGNOSIS — I10 Essential (primary) hypertension: Secondary | ICD-10-CM | POA: Diagnosis not present

## 2020-12-01 DIAGNOSIS — R109 Unspecified abdominal pain: Secondary | ICD-10-CM | POA: Diagnosis not present

## 2020-12-01 DIAGNOSIS — E119 Type 2 diabetes mellitus without complications: Secondary | ICD-10-CM | POA: Insufficient documentation

## 2020-12-01 DIAGNOSIS — R059 Cough, unspecified: Secondary | ICD-10-CM | POA: Diagnosis not present

## 2020-12-01 DIAGNOSIS — Z87891 Personal history of nicotine dependence: Secondary | ICD-10-CM | POA: Diagnosis not present

## 2020-12-01 DIAGNOSIS — Z7984 Long term (current) use of oral hypoglycemic drugs: Secondary | ICD-10-CM | POA: Diagnosis not present

## 2020-12-01 LAB — CBC
HCT: 50 % (ref 39.0–52.0)
Hemoglobin: 17.6 g/dL — ABNORMAL HIGH (ref 13.0–17.0)
MCH: 31.7 pg (ref 26.0–34.0)
MCHC: 35.2 g/dL (ref 30.0–36.0)
MCV: 90.1 fL (ref 80.0–100.0)
Platelets: 234 10*3/uL (ref 150–400)
RBC: 5.55 MIL/uL (ref 4.22–5.81)
RDW: 12.6 % (ref 11.5–15.5)
WBC: 11 10*3/uL — ABNORMAL HIGH (ref 4.0–10.5)
nRBC: 0 % (ref 0.0–0.2)

## 2020-12-01 LAB — URINALYSIS, COMPLETE (UACMP) WITH MICROSCOPIC
Bacteria, UA: NONE SEEN
Bilirubin Urine: NEGATIVE
Glucose, UA: 50 mg/dL — AB
Hgb urine dipstick: NEGATIVE
Ketones, ur: 5 mg/dL — AB
Leukocytes,Ua: NEGATIVE
Nitrite: NEGATIVE
Protein, ur: NEGATIVE mg/dL
Specific Gravity, Urine: 1.03 (ref 1.005–1.030)
WBC, UA: NONE SEEN WBC/hpf (ref 0–5)
pH: 5 (ref 5.0–8.0)

## 2020-12-01 LAB — LIPASE, BLOOD: Lipase: 42 U/L (ref 11–51)

## 2020-12-01 LAB — COMPREHENSIVE METABOLIC PANEL
ALT: 14 U/L (ref 0–44)
AST: 23 U/L (ref 15–41)
Albumin: 4.1 g/dL (ref 3.5–5.0)
Alkaline Phosphatase: 63 U/L (ref 38–126)
Anion gap: 7 (ref 5–15)
BUN: 20 mg/dL (ref 8–23)
CO2: 25 mmol/L (ref 22–32)
Calcium: 8.7 mg/dL — ABNORMAL LOW (ref 8.9–10.3)
Chloride: 105 mmol/L (ref 98–111)
Creatinine, Ser: 0.76 mg/dL (ref 0.61–1.24)
GFR, Estimated: >60 mL/min (ref >60)
Glucose, Bld: 166 mg/dL — ABNORMAL HIGH (ref 70–99)
Potassium: 4.3 mmol/L (ref 3.5–5.1)
Sodium: 137 mmol/L (ref 135–145)
Total Bilirubin: 1 mg/dL (ref 0.3–1.2)
Total Protein: 6.9 g/dL (ref 6.5–8.1)

## 2020-12-01 NOTE — ED Triage Notes (Signed)
Pt reports abd pain with vomiting and diarrhea.  Vomited x 3 today.  No back pain.  Denies urinary sx.  Pt alert  speech clear.

## 2020-12-02 ENCOUNTER — Encounter: Payer: Self-pay | Admitting: Urology

## 2020-12-02 ENCOUNTER — Emergency Department
Admission: EM | Admit: 2020-12-02 | Discharge: 2020-12-02 | Disposition: A | Discharge status: Home / Self Care | Payer: Medicare PPO | Attending: Emergency Medicine | Admitting: Emergency Medicine

## 2020-12-02 ENCOUNTER — Ambulatory Visit: Payer: Medicare PPO | Admitting: Urology

## 2020-12-02 VITALS — BP 132/77 | HR 101 | Ht 75.75 in | Wt 256.0 lb

## 2020-12-02 DIAGNOSIS — R972 Elevated prostate specific antigen [PSA]: Secondary | ICD-10-CM | POA: Diagnosis not present

## 2020-12-02 DIAGNOSIS — R197 Diarrhea, unspecified: Secondary | ICD-10-CM

## 2020-12-02 DIAGNOSIS — R112 Nausea with vomiting, unspecified: Secondary | ICD-10-CM

## 2020-12-02 DIAGNOSIS — N529 Male erectile dysfunction, unspecified: Secondary | ICD-10-CM

## 2020-12-02 LAB — RESP PANEL BY RT-PCR (FLU A&B, COVID) ARPGX2
Influenza A by PCR: NEGATIVE
Influenza B by PCR: NEGATIVE
SARS Coronavirus 2 by RT PCR: NEGATIVE

## 2020-12-02 LAB — MAGNESIUM: Magnesium: 1.7 mg/dL (ref 1.7–2.4)

## 2020-12-02 MED ORDER — ONDANSETRON HCL 4 MG/2ML IJ SOLN
4.0000 mg | Freq: Once | INTRAMUSCULAR | Status: AC
  Administered 2020-12-02: 4 mg via INTRAVENOUS
  Filled 2020-12-02: qty 2

## 2020-12-02 MED ORDER — SODIUM CHLORIDE 0.9 % IV BOLUS (SEPSIS)
1000.0000 mL | Freq: Once | INTRAVENOUS | Status: AC
  Administered 2020-12-02: 1000 mL via INTRAVENOUS

## 2020-12-02 MED ORDER — ONDANSETRON 4 MG PO TBDP: 4.0000 mg | ORAL_TABLET | Freq: Four times a day (QID) | ORAL | 0 refills | Status: DC | PRN

## 2020-12-02 MED ORDER — DICYCLOMINE HCL 10 MG/ML IM SOLN
20.0000 mg | Freq: Once | INTRAMUSCULAR | Status: AC
  Administered 2020-12-02: 20 mg via INTRAMUSCULAR
  Filled 2020-12-02: qty 2

## 2020-12-02 MED ORDER — DICYCLOMINE HCL 20 MG PO TABS: 20.0000 mg | ORAL_TABLET | Freq: Three times a day (TID) | ORAL | 0 refills | Status: DC | PRN

## 2020-12-02 NOTE — ED Provider Notes (Signed)
Has had  [facility] Emergency Department Provider Note  ____________________________________________   Event Date/Time   First MD Initiated Contact with Patient 12/02/20 [phone]     (approximate)  I have reviewed the triage vital signs and the nursing notes.   HISTORY  Chief Complaint Abdominal Pain    HPI Curtis Farmer is a 70 y.o. male with history of hypertension, diabetes, hyperlipidemia who presents to the emergency department with 4 days of nausea, vomiting and diarrhea.  Having diffuse abdominal cramping.  No fevers or chills.  No chest pain, shortness of breath.  Has had a productive cough as well.  States he did recently travel to [place].  No international travel.  No sick contacts, recent antibiotic use or hospitalization.  States he has had a previous cholecystectomy and appendectomy.  States he feels weak because he is not able to keep anything down.  States he has had 2 negative COVID test at home.        Past Medical History:  Diagnosis Date   Diabetes mellitus without complication ([place])    Hyperlipidemia    Hypertension    Impingement syndrome of shoulder, left    Irritable bowel disease    Numbness     Patient Active Problem List   Diagnosis Date Noted   Diabetes mellitus type 2, uncomplicated ([place]) 67/20/9470   Hyperlipidemia 09/18/2013   Hypertension 09/18/2013   Numbness 09/18/2013    Past Surgical History:  Procedure Laterality Date   APPENDECTOMY     BACK SURGERY     CHOLECYSTECTOMY     COLONOSCOPY     COLONOSCOPY WITH PROPOFOL N/A 01/11/2016   Procedure: COLONOSCOPY WITH PROPOFOL;  Surgeon: Lollie Sails, MD;  Location: [facility] ENDOSCOPY;  Service: Endoscopy;  Laterality: N/A;    Prior to Admission medications   Medication Sig Start Date End Date Taking? Authorizing Provider  dicyclomine (BENTYL) 20 MG tablet Take 1 tablet (20 mg total) by mouth every 8 (eight) hours as needed for spasms (Abdominal cramping).  12/02/20  Yes ,  N, DO  ondansetron (ZOFRAN ODT) 4 MG disintegrating tablet Take 1 tablet (4 mg total) by mouth every 6 (six) hours as needed for nausea or vomiting. 12/02/20  Yes ,  N, DO  albuterol (PROVENTIL HFA;VENTOLIN HFA) 108 (90 Base) MCG/ACT inhaler Inhale 2 puffs into the lungs every 6 (six) hours as needed for wheezing or shortness of breath.    [provider]  colestipol (COLESTID) 1 g tablet Take 1 g by mouth 2 (two) times daily.    [provider]  esomeprazole (NEXIUM) 20 MG capsule Take by mouth. 03/21/19   [provider]  lisinopril (PRINIVIL,ZESTRIL) 20 MG tablet Take 20 mg by mouth daily.    [provider]  metFORMIN (GLUCOPHAGE) 1000 MG tablet Take 1,000 mg by mouth 2 (two) times daily with a meal.    [provider]  pioglitazone (ACTOS) 45 MG tablet Take 45 mg by mouth daily.    [provider]  simvastatin (ZOCOR) 20 MG tablet Take 20 mg by mouth daily.    [provider]  sitaGLIPtin (JANUVIA) 100 MG tablet Take 100 mg by mouth daily.    [provider]  tadalafil (CIALIS) 20 MG tablet Take 1 tablet (20 mg total) by mouth daily as needed for erectile dysfunction. 04/29/19   [name], MD    Allergies Pecan nut (diagnostic) and Penicillin v potassium  No family history on file.  Social  History Social History   Tobacco Use   Smoking status: Former   Smokeless tobacco: Never  Scientific laboratory technician Use: Never used  Substance Use Topics   Alcohol use: No   Drug use: No    Review of Systems Constitutional: No fever. Eyes: No visual changes. ENT: No sore throat. Cardiovascular: Denies chest pain. Respiratory: Denies shortness of breath. Gastrointestinal: + nausea, vomiting, diarrhea. Genitourinary: Negative for dysuria. Musculoskeletal: Negative for back pain. Skin: Negative for rash. Neurological: Negative for focal weakness or  numbness.  ____________________________________________   PHYSICAL EXAM:  VITAL SIGNS: ED Triage Vitals  Enc Vitals Group     BP 12/01/20 2035 (!) 146/80     Pulse Rate 12/01/20 2035 82     Resp 12/01/20 2035 20     Temp 12/01/20 2035 98.6 F (37 C)     Temp Source 12/01/20 2035 Oral     SpO2 12/01/20 2035 99 %     Weight 12/01/20 2032 265 lb (120.2 kg)     Height 12/01/20 2032 6\' 4"  (1.93 m)     Head Circumference --      Peak Flow --      Pain Score 12/01/20 2032 6     Pain Loc --      Pain Edu? --      Excl. in [place]? --    CONSTITUTIONAL: Alert and oriented and responds appropriately to questions. Well-appearing; well-nourished HEAD: Normocephalic EYES: Conjunctivae clear, pupils appear equal, EOM appear intact ENT: normal nose; moist mucous membranes NECK: Supple, normal ROM CARD: RRR; S1 and S2 appreciated; no murmurs, no clicks, no rubs, no gallops RESP: Normal chest excursion without splinting or tachypnea; breath sounds clear and equal bilaterally; no wheezes, no rhonchi, no rales, no hypoxia or respiratory distress, speaking full sentences ABD/GI: Normal bowel sounds; non-distended; soft, non-tender, no rebound, no guarding, no peritoneal signs, no hepatosplenomegaly BACK: The back appears normal EXT: Normal ROM in all joints; no deformity noted, no edema; no cyanosis SKIN: Normal color for age and race; warm; no rash on exposed skin NEURO: Moves all extremities equally PSYCH: The patient's mood and manner are appropriate.  ____________________________________________   LABS (all labs ordered are listed, but only abnormal results are displayed)  Labs Reviewed  COMPREHENSIVE METABOLIC PANEL - Abnormal; Notable for the following components:      Result Value   Glucose, Bld 166 (*)    Calcium 8.7 (*)    All other components within normal limits  CBC - Abnormal; Notable for the following components:   WBC 11.0 (*)    Hemoglobin 17.6 (*)    All other components  within normal limits  URINALYSIS, COMPLETE (UACMP) WITH MICROSCOPIC - Abnormal; Notable for the following components:   Color, Urine YELLOW (*)    APPearance CLEAR (*)    Glucose, UA 50 (*)    Ketones, ur 5 (*)    All other components within normal limits  RESP PANEL BY RT-PCR (FLU A&B, COVID) [id number]  GASTROINTESTINAL PANEL BY PCR, STOOL (REPLACES STOOL CULTURE)  C DIFFICILE QUICK SCREEN W PCR REFLEX    LIPASE, BLOOD  MAGNESIUM   ____________________________________________  EKG   ____________________________________________  RADIOLOGY I,  , personally viewed and evaluated these images (plain radiographs) as part of my medical decision making, as well as reviewing the written report by the radiologist.  ED MD interpretation: CT scan shows no acute abnormality.  Official radiology report(s): CT Abdomen Pelvis Wo Contrast  Result Date:  12/01/2020 CLINICAL DATA:  Abdominal pain with vomiting and diarrhea. EXAM: CT ABDOMEN AND PELVIS WITHOUT CONTRAST TECHNIQUE: Multidetector CT imaging of the abdomen and pelvis was performed following the standard protocol without IV contrast. COMPARISON:  None. FINDINGS: Lower chest: No acute abnormality. Hepatobiliary: No focal liver abnormality is seen. Status post cholecystectomy. No biliary dilatation. Pancreas: Unremarkable. No pancreatic ductal dilatation or surrounding inflammatory changes. Spleen: Normal in size without focal abnormality. Adrenals/Urinary Tract: Adrenal glands are unremarkable. Kidneys are normal in size, without renal calculi or hydronephrosis. A 2.7 cm diameter cyst is seen within the anterior aspect of the lower pole of the right kidney. Bladder is unremarkable. Stomach/Bowel: Stomach is within normal limits. The appendix is not identified. No evidence of bowel wall thickening, distention, or inflammatory changes. Noninflamed diverticula are seen throughout the sigmoid colon. Vascular/Lymphatic: Aortic atherosclerosis.  No enlarged abdominal or pelvic lymph nodes. Reproductive: The prostate gland is mildly enlarged. Other: There is a 3.5 cm x 2.4 cm fat containing right inguinal hernia. A 5.7 cm x 3.4 cm fat containing left inguinal hernia is also noted. Musculoskeletal: Multilevel degenerative changes seen within the lumbar spine. This is most prominent the level of L5-S1. IMPRESSION: 1. Evidence of prior cholecystectomy. 2. Sigmoid diverticulosis. 3. Bilateral fat containing inguinal hernias. Aortic Atherosclerosis (ICD10-I70.0). Electronically Signed   By: Virgina Norfolk M.D.   On: 12/01/2020 23:36    ____________________________________________   PROCEDURES  Procedure(s) performed (including Critical Care):  Procedures    ____________________________________________   INITIAL IMPRESSION / ASSESSMENT AND PLAN / ED COURSE  As part of my medical decision making, I reviewed the following data within the [place] History obtained from family, Nursing notes reviewed and incorporated, Labs reviewed , Old chart reviewed, CT reviewed, Notes from prior ED visits, and [place] Controlled Substance Database         Patient here with nausea, vomiting and diarrhea.  I suspect that he has a viral gastroenteritis.  His abdominal exam is benign and his CT was obtained from triage shows no acute abnormality.  Labs, urine reassuring.  Discussed with patient that we can send stool samples if he is able to have a bowel movement here but states he has not had a BM in 4 to 5 hours since being in the ED.  Also has not had any further vomiting.  Will give IV fluids, Bentyl, Zofran and p.o. challenge.  Given he is also complaining of cough, offered COVID testing which he agrees to today.  ED PROGRESS  COVID and flu testing is negative today.  Patient reports he feels much better and has been able to tolerate p.o.  He has not had any diarrhea here in the emergency department to send off stool studies.  Suspect  viral illness causing his symptoms today.  Discussed supportive care instructions and return precautions.  Will discharge with prescriptions of Zofran, Bentyl to take as needed.  Recommended over-the-counter Tylenol, Motrin, Imodium as needed.  Recommended advancing diet slowly.  Patient and wife verbalized understanding and are comfortable with this plan.  At this time, I do not feel there is any life-threatening condition present. I have reviewed, interpreted and discussed all results (EKG, imaging, lab, urine as appropriate) and exam findings with patient/family. I have reviewed nursing notes and appropriate previous records.  I feel the patient is safe to be discharged home without further emergent workup and can continue workup as an outpatient as needed. Discussed usual and customary return precautions. Patient/family verbalize understanding  and are comfortable with this plan.  Outpatient follow-up has been provided as needed. All questions have been answered.  ____________________________________________   FINAL CLINICAL IMPRESSION(S) / ED DIAGNOSES  Final diagnoses:  Nausea vomiting and diarrhea     ED Discharge Orders          Ordered    ondansetron (ZOFRAN ODT) 4 MG disintegrating tablet  Every 6 hours PRN        12/02/20 0418    dicyclomine (BENTYL) 20 MG tablet  Every 8 hours PRN        12/02/20 0418            *Please note:  Curtis Farmer was evaluated in Emergency Department on 12/02/2020 for the symptoms described in the history of present illness. He was evaluated in the context of the global COVID-19 pandemic, which necessitated consideration that the patient might be at risk for infection with the SARS-CoV-2 virus that causes COVID-19. Institutional protocols and algorithms that pertain to the evaluation of patients at risk for COVID-19 are in a state of rapid change based on information released by regulatory bodies including the CDC and federal and state  organizations. These policies and algorithms were followed during the patient's care in the ED.  Some ED evaluations and interventions may be delayed as a result of limited staffing during and the pandemic.*   Note:  This document was prepared using Dragon voice recognition software and may include unintentional dictation errors.    , Delice Bison, DO 12/02/20 [phone]

## 2020-12-02 NOTE — ED Notes (Signed)
Po challenge Passed

## 2020-12-02 NOTE — Patient Instructions (Addendum)
Prostate MRI Prep:  1- No ejaculation 48 hours prior to exam  2- No food or drink or caffeine 4 hours prior to exam  3- Fleets enema needs to be done 4 hours prior to exam   4- Urinate just prior to exam   Prostate Cancer Screening Prostate cancer screening is a test that is done to check for the presence of prostate cancer in men. The prostate gland is a walnut-sized gland that is located below the bladder and in front of the rectum in males. The function of the prostate is to add fluid to semen during ejaculation. Prostate cancer is the second most common type of cancer in men. Who should have prostate cancer screening? Screening recommendations vary based on age and other risk factors. Screening is recommended if: You are older than age 28. If you are age 40-69, talk with your health care provider about your need for screening and how often screening should be done. Because most prostate cancers are slow growing and will not cause death, screening is generally reserved in this age group for men who have a 10-15-year life expectancy. You are younger than age 14, and you have these risk factors: Being a [place] male or a male of African descent. Having a father, brother, or uncle who has been diagnosed with prostate cancer. The risk is higher if your family member's cancer occurred at an early age. Screening is not recommended if: You are younger than age 78. You are between the ages of 61 and 30 and you have no risk factors. You are 15 years of age or older. At this age, the risks that screening can cause are greater than the benefits that it may provide. If you are at high risk for prostate cancer, your health care provider may recommend that you have screenings more often or that you start screening at a younger age. How is screening for prostate cancer done? The recommended prostate cancer screening test is a blood test called the prostate-specific antigen (PSA) test. PSA is a protein  that is made in the prostate. As you age, your prostate naturally produces more PSA. Abnormally high PSA levels may be caused by: Prostate cancer. An enlarged prostate that is not caused by cancer (benign prostatic hyperplasia, BPH). This condition is very common in older men. A prostate gland infection (prostatitis). Depending on the PSA results, you may need more tests, such as: A physical exam to check the size of your prostate gland. Blood and imaging tests. A procedure to remove tissue samples from your prostate gland for testing (biopsy). What are the benefits of prostate cancer screening? Screening can help to identify cancer at an early stage, before symptoms start and when the cancer can be treated more easily. There is a small chance that screening may lower your risk of dying from prostate cancer. The chance is small because prostate cancer is a slow-growing cancer, and most men with prostate cancer die from a different cause. What are the risks of prostate cancer screening? The main risk of prostate cancer screening is diagnosing and treating prostate cancer that would never have caused any symptoms or problems. This is called overdiagnosisand overtreatment. PSA screening cannot tell you if your PSA is high due to cancer or a different cause. A prostate biopsy is the only procedure to diagnose prostate cancer. Even the results of a biopsy may not tell you if your cancer needs to be treated. Slow-growing prostate cancer may not need any treatment other  than monitoring, so diagnosing and treating it may cause unnecessary stress or other side effects. Questions to ask your health care provider When should I start prostate cancer screening? What is my risk for prostate cancer? How often do I need screening? What type of screening tests do I need? How do I get my test results? What do my results mean? Do I need treatment? Where to find more information The American Cancer Society:  www.cancer.org American Urological Association: www.auanet.org Contact a health care provider if: You have difficulty urinating. You have pain when you urinate or ejaculate. You have blood in your urine or semen. You have pain in your back or in the area of your prostate. Summary Prostate cancer is a common type of cancer in men. The prostate gland is located below the bladder and in front of the rectum. This gland adds fluid to semen during ejaculation. Prostate cancer screening may identify cancer at an early stage, when the cancer can be treated more easily. The prostate-specific antigen (PSA) test is the recommended screening test for prostate cancer. Discuss the risks and benefits of prostate cancer screening with your health care provider. If you are age 45 or older, the risks that screening can cause are greater than the benefits that it may provide. This information is not intended to replace advice given to you by your health care provider. Make sure you discuss any questions you have with your health care provider. Document Revised: 04/16/2020 Document Reviewed: 09/26/2018 Elsevier Patient Education  [employer].

## 2020-12-02 NOTE — ED Notes (Signed)
Pt and family updated on care plan. They had no questions.

## 2020-12-02 NOTE — Discharge Instructions (Addendum)
You may alternate Tylenol 1000 mg every 6 hours as needed for pain, fever and Ibuprofen 800 mg every 8 hours as needed for pain, fever.  Please take Ibuprofen with food.  Do not take more than 4000 mg of Tylenol (acetaminophen) in a 24 hour period.  You may take over-the-counter Imodium as needed for diarrhea.   I recommend a bland diet for the next couple of days and then slowly advance her diet as tolerated.

## 2020-12-02 NOTE — Progress Notes (Signed)
   12/02/2020 11:37 AM   Curtis Farmer 03-Jul-1950 [id number]  Reason for visit: Follow up elevated PSA, ED  HPI: 70 year old male with no family history of prostate cancer who was originally found to have an elevated PSA of 6.9 and underwent a prostate biopsy in February 2020 that showed a 64 g prostate with a PSA density of 0.11 and showed only benign prostatic tissue.  Follow-up PSA in June 2020 was stable at 5.2, and even lower at 4.7 in February 2021.  He denies significant urinary symptoms, and uses 20 mg Cialis on demand for ED.  He continued PSA screening with his PCP which was stable at 4.9 in October 2021, but then increased to 7.44 in August 2022, and on repeat in September 2022 increased to 9.44.  He was in the ER overnight with nausea and vomiting that has since resolved, and work-up with urinalysis and CT scan are completely benign.  I personally viewed and interpreted the CT scan that shows no urologic abnormalities, and mildly enlarged prostate.  We discussed options for his elevated PSA including repeat PSA with reflex to free in 3 to 6 months, 4K score, prostate MRI, or repeat prostate biopsy.  I recommended prostate MRI with his history of negative biopsy and persistent increase in the PSA.  We reviewed the AUA guidelines regarding the risks and benefits of screening.  He is amenable to prostate MRI, and we will call with those results.  We discussed the need for possible fusion biopsy in the future pending MRI finding  Prostate MRI, call with results   [name], [place] [address] [place], [place] [address] [phone]

## 2020-12-16 ENCOUNTER — Other Ambulatory Visit: Payer: Self-pay

## 2020-12-16 ENCOUNTER — Encounter: Payer: Self-pay | Admitting: Emergency Medicine

## 2020-12-16 ENCOUNTER — Ambulatory Visit
Admission: EM | Admit: 2020-12-16 | Discharge: 2020-12-16 | Disposition: A | Discharge status: Home / Self Care | Payer: Medicare PPO | Attending: Internal Medicine | Admitting: Internal Medicine

## 2020-12-16 ENCOUNTER — Ambulatory Visit: Payer: Medicare PPO

## 2020-12-16 DIAGNOSIS — K580 Irritable bowel syndrome with diarrhea: Secondary | ICD-10-CM | POA: Insufficient documentation

## 2020-12-16 LAB — CBC WITH DIFFERENTIAL/PLATELET
Abs Immature Granulocytes: 0.03 10*3/uL (ref 0.00–0.07)
Basophils Absolute: 0 10*3/uL (ref 0.0–0.1)
Basophils Relative: 0 %
Eosinophils Absolute: 0.3 10*3/uL (ref 0.0–0.5)
Eosinophils Relative: 3 %
HCT: 51.6 % (ref 39.0–52.0)
Hemoglobin: 17.4 g/dL — ABNORMAL HIGH (ref 13.0–17.0)
Immature Granulocytes: 0 %
Lymphocytes Relative: 21 %
Lymphs Abs: 2.3 10*3/uL (ref 0.7–4.0)
MCH: 30.7 pg (ref 26.0–34.0)
MCHC: 33.7 g/dL (ref 30.0–36.0)
MCV: 91.2 fL (ref 80.0–100.0)
Monocytes Absolute: 1.2 10*3/uL — ABNORMAL HIGH (ref 0.1–1.0)
Monocytes Relative: 11 %
Neutro Abs: 7.3 10*3/uL (ref 1.7–7.7)
Neutrophils Relative %: 65 %
Platelets: 254 10*3/uL (ref 150–400)
RBC: 5.66 MIL/uL (ref 4.22–5.81)
RDW: 13 % (ref 11.5–15.5)
WBC: 11.2 10*3/uL — ABNORMAL HIGH (ref 4.0–10.5)
nRBC: 0 % (ref 0.0–0.2)

## 2020-12-16 LAB — BASIC METABOLIC PANEL
Anion gap: 7 (ref 5–15)
BUN: 22 mg/dL (ref 8–23)
CO2: 28 mmol/L (ref 22–32)
Calcium: 8.6 mg/dL — ABNORMAL LOW (ref 8.9–10.3)
Chloride: 101 mmol/L (ref 98–111)
Creatinine, Ser: 0.81 mg/dL (ref 0.61–1.24)
GFR, Estimated: >60 mL/min (ref >60)
Glucose, Bld: 168 mg/dL — ABNORMAL HIGH (ref 70–99)
Potassium: 4.1 mmol/L (ref 3.5–5.1)
Sodium: 136 mmol/L (ref 135–145)

## 2020-12-16 LAB — C-REACTIVE PROTEIN: CRP: 0.8 mg/dL (ref <1.0)

## 2020-12-16 MED ORDER — DICYCLOMINE HCL 20 MG PO TABS: 20.0000 mg | ORAL_TABLET | Freq: Three times a day (TID) | ORAL | 0 refills | Status: DC | PRN

## 2020-12-16 MED ORDER — ONDANSETRON 4 MG PO TBDP: 4.0000 mg | ORAL_TABLET | Freq: Four times a day (QID) | ORAL | 0 refills | Status: DC | PRN

## 2020-12-16 MED ORDER — LOPERAMIDE HCL 2 MG PO CAPS: 2.0000 mg | ORAL_CAPSULE | ORAL | 0 refills | Status: DC | PRN

## 2020-12-16 NOTE — Discharge Instructions (Addendum)
Please take medications as prescribed Maintain adequate hydration Please reach out to your gastroenterologist to be reevaluated. You do not need CT evaluation since she recently had a CT scan of your abdomen for similar symptoms We will call you with recommendations if labs are abnormal. Return to urgent care if symptoms worsen.

## 2020-12-16 NOTE — ED Triage Notes (Signed)
Pt presents today with c/o of abdominal pain (mid) with n/v/d x 3 days. Denies fever. He was seen in ER on 12/02/20 for same and reports he has had "three bouts" of n/v/d since then. He has been using Imodium and Zofran.

## 2020-12-16 NOTE — ED Provider Notes (Signed)
[facility]    CSN: [id number] Arrival date & time: 12/16/20  1111      History   Chief Complaint Chief Complaint  Patient presents with   Diarrhea   Abdominal Pain   Nausea   Emesis    HPI Curtis Farmer is a 70 y.o. male with a history of irritable bowel syndrome with diarrhea comes to urgent care with a few days history of diarrhea, nausea and vomiting.  He denies any fever or chills.  His bowel movements are nonbloody and nonmucoid.  He has several bowel movements during the day.  No sick contacts.  Symptoms are associated with nonbloody nonbilious vomiting.  No abdominal distention or bloating.  Patient's symptoms have been well controlled with colestipol in the past.Patient hasn't seen his gastroenterologist recently.  No fever or chills.   HPI  Past Medical History:  Diagnosis Date   Diabetes mellitus without complication ([place])    Hyperlipidemia    Hypertension    Impingement syndrome of shoulder, left    Irritable bowel disease    Numbness     Patient Active Problem List   Diagnosis Date Noted   Diabetes mellitus type 2, uncomplicated ([place]) 96/29/5284   Hyperlipidemia 09/18/2013   Hypertension 09/18/2013   Numbness 09/18/2013    Past Surgical History:  Procedure Laterality Date   APPENDECTOMY     BACK SURGERY     CHOLECYSTECTOMY     COLONOSCOPY     COLONOSCOPY WITH PROPOFOL N/A 01/11/2016   Procedure: COLONOSCOPY WITH PROPOFOL;  Surgeon: Lollie Sails, MD;  Location: [facility] ENDOSCOPY;  Service: Endoscopy;  Laterality: N/A;       Home Medications    Prior to Admission medications   Medication Sig Start Date End Date Taking? Authorizing Provider  metFORMIN (GLUCOPHAGE) 1000 MG tablet Take 1,000 mg by mouth 2 (two) times daily with a meal.   Yes [provider]  albuterol (PROVENTIL HFA;VENTOLIN HFA) 108 (90 Base) MCG/ACT inhaler Inhale 2 puffs into the lungs every 6 (six) hours as needed for wheezing or shortness of breath.     [provider]  colestipol (COLESTID) 1 g tablet Take 1 g by mouth 2 (two) times daily.    [provider]  dicyclomine (BENTYL) 20 MG tablet Take 1 tablet (20 mg total) by mouth every 8 (eight) hours as needed for spasms (Abdominal cramping). 12/16/20   Chase Picket, MD  esomeprazole (NEXIUM) 20 MG capsule Take by mouth. 03/21/19   [provider]  lisinopril (PRINIVIL,ZESTRIL) 20 MG tablet Take 20 mg by mouth daily.    [provider]  loperamide (IMODIUM) 2 MG capsule Take 1 capsule (2 mg total) by mouth as needed for diarrhea or loose stools. 12/16/20   Chase Picket, MD  ondansetron (ZOFRAN ODT) 4 MG disintegrating tablet Take 1 tablet (4 mg total) by mouth every 6 (six) hours as needed for nausea or vomiting. 12/16/20   Chase Picket, MD  pioglitazone (ACTOS) 45 MG tablet Take 45 mg by mouth daily.    [provider]  simvastatin (ZOCOR) 20 MG tablet Take 20 mg by mouth daily.    [provider]  sitaGLIPtin (JANUVIA) 100 MG tablet Take 100 mg by mouth daily.    [provider]  tadalafil (CIALIS) 20 MG tablet Take 1 tablet (20 mg total) by mouth daily as needed for erectile dysfunction. 04/29/19   [name], MD    Family History History reviewed. No  pertinent family history.  Social History Social History   Tobacco Use   Smoking status: Former   Smokeless tobacco: Never  Scientific laboratory technician Use: Never used  Substance Use Topics   Alcohol use: No   Drug use: No     Allergies   Pecan nut (diagnostic) and Penicillin v potassium   Review of Systems Review of Systems  Constitutional: Negative.   HENT: Negative.    Gastrointestinal:  Positive for abdominal pain, diarrhea, nausea and vomiting.  Musculoskeletal: Negative.   Neurological: Negative.     Physical Exam Triage Vital Signs ED Triage Vitals  Enc Vitals Group     BP 12/16/20 1230 114/71     Pulse Rate 12/16/20 1230 98     Resp  12/16/20 1232 18     Temp 12/16/20 1230 98.4 F (36.9 C)     Temp Source 12/16/20 1230 Oral     SpO2 12/16/20 1230 96 %     Weight --      Height --      Head Circumference --      Peak Flow --      Pain Score 12/16/20 1226 4     Pain Loc --      Pain Edu? --      Excl. in [place]? --    No data found.  Updated Vital Signs BP 114/71 (BP Location: Left Arm)   Pulse 98   Temp 98.4 F (36.9 C) (Oral)   Resp 18   SpO2 96%   Visual Acuity Right Eye Distance:   Left Eye Distance:   Bilateral Distance:    Right Eye Near:   Left Eye Near:    Bilateral Near:     Physical Exam Vitals and nursing note reviewed.  Constitutional:      General: He is not in acute distress.    Appearance: He is not ill-appearing.  Abdominal:     General: Bowel sounds are normal.     Palpations: Abdomen is soft. There is no shifting dullness, hepatomegaly or splenomegaly.     Tenderness: There is no abdominal tenderness. There is no guarding or rebound. Negative signs include McBurney's sign.     Hernia: No hernia is present.  Neurological:     Mental Status: He is alert.     UC Treatments / Results  Labs (all labs ordered are listed, but only abnormal results are displayed) Labs Reviewed  CBC WITH DIFFERENTIAL/PLATELET - Abnormal; Notable for the following components:      Result Value   WBC 11.2 (*)    Hemoglobin 17.4 (*)    Monocytes Absolute 1.2 (*)    All other components within normal limits  BASIC METABOLIC PANEL  C-REACTIVE PROTEIN    EKG   Radiology No results found.  Procedures Procedures (including critical care time)  Medications Ordered in UC Medications - No data to display  Initial Impression / Assessment and Plan / UC Course  I have reviewed the triage vital signs and the nursing notes.  Pertinent labs & imaging results that were available during my care of the patient were reviewed by me and considered in my medical decision making (see chart for details).      Gastroenteritis likely related to irritable bowel syndrome: Patient recently had a CT of the abdomen which was unremarkable CBC, BMP, CRP Dicyclomine as needed for abdominal pain Zofran as needed for nausea/vomiting Imodium as needed for diarrhea. Abdominal exam is benign  Follow-up with gastroenterologist as recommended.       We will call patient with recommendations if labs are abnormal. Final Clinical Impressions(s) / UC Diagnoses   Final diagnoses:  Irritable bowel syndrome with diarrhea     Discharge Instructions      Please take medications as prescribed Maintain adequate hydration Please reach out to your gastroenterologist to be reevaluated. You do not need CT evaluation since she recently had a CT scan of your abdomen for similar symptoms We will call you with recommendations if labs are abnormal. Return to urgent care if symptoms worsen.   ED Prescriptions     Medication Sig Dispense Auth. Provider   dicyclomine (BENTYL) 20 MG tablet  (Status: Discontinued) Take 1 tablet (20 mg total) by mouth every 8 (eight) hours as needed for spasms (Abdominal cramping). 20 tablet , Myrene Galas, MD   ondansetron (ZOFRAN ODT) 4 MG disintegrating tablet  (Status: Discontinued) Take 1 tablet (4 mg total) by mouth every 6 (six) hours as needed for nausea or vomiting. 20 tablet , Myrene Galas, MD   loperamide (IMODIUM) 2 MG capsule Take 1 capsule (2 mg total) by mouth as needed for diarrhea or loose stools. 30 capsule , Myrene Galas, MD   dicyclomine (BENTYL) 20 MG tablet Take 1 tablet (20 mg total) by mouth every 8 (eight) hours as needed for spasms (Abdominal cramping). 20 tablet , Myrene Galas, MD   ondansetron (ZOFRAN ODT) 4 MG disintegrating tablet Take 1 tablet (4 mg total) by mouth every 6 (six) hours as needed for nausea or vomiting. 20 tablet , Myrene Galas, MD      PDMP not reviewed this encounter.   Chase Picket, MD 12/16/20 1321

## 2020-12-20 ENCOUNTER — Other Ambulatory Visit: Payer: Self-pay

## 2020-12-20 ENCOUNTER — Ambulatory Visit
Admission: RE | Admit: 2020-12-20 | Discharge: 2020-12-20 | Disposition: A | Discharge status: Home / Self Care | Payer: Medicare PPO | Source: Ambulatory Visit | Attending: Urology | Admitting: Urology

## 2020-12-20 DIAGNOSIS — R972 Elevated prostate specific antigen [PSA]: Secondary | ICD-10-CM | POA: Diagnosis not present

## 2020-12-20 MED ORDER — GADOBUTROL 1 MMOL/ML IV SOLN
10.0000 mL | Freq: Once | INTRAVENOUS | Status: AC | PRN
  Administered 2020-12-20: 10 mL via INTRAVENOUS

## 2020-12-24 ENCOUNTER — Telehealth: Payer: Self-pay

## 2020-12-24 DIAGNOSIS — R972 Elevated prostate specific antigen [PSA]: Secondary | ICD-10-CM

## 2020-12-24 NOTE — Telephone Encounter (Signed)
Called pt informed him of the information below. Pt gave verbal understanding. Appt schduled. PSA ordered.

## 2020-12-24 NOTE — Telephone Encounter (Signed)
-----   Message from [name], MD sent at 12/23/2020 12:51 PM EDT ----- There was 1 small indeterminate lesion on the prostate MRI, but this was not highly suspicious for prostate cancer.  I would recommend follow-up in 2 months with repeat PSA, reflex to free prior, and only consider a repeat biopsy if PSA continues to rise  [name], MD 12/23/2020

## 2021-02-22 ENCOUNTER — Ambulatory Visit: Payer: Medicare PPO | Admitting: Urology

## 2021-03-01 ENCOUNTER — Encounter: Payer: Self-pay | Admitting: *Deleted

## 2021-03-01 ENCOUNTER — Ambulatory Visit
Admission: RE | Admit: 2021-03-01 | Discharge: 2021-03-01 | Disposition: A | Discharge status: Home / Self Care | Payer: Medicare PPO | Source: Ambulatory Visit | Attending: Gastroenterology | Admitting: Gastroenterology

## 2021-03-01 ENCOUNTER — Encounter
Admission: RE | Disposition: A | Discharge status: Home / Self Care | Payer: Self-pay | Source: Ambulatory Visit | Attending: Gastroenterology

## 2021-03-01 ENCOUNTER — Ambulatory Visit: Payer: Medicare PPO | Admitting: Certified Registered Nurse Anesthetist

## 2021-03-01 DIAGNOSIS — I10 Essential (primary) hypertension: Secondary | ICD-10-CM | POA: Diagnosis not present

## 2021-03-01 DIAGNOSIS — Z87891 Personal history of nicotine dependence: Secondary | ICD-10-CM | POA: Diagnosis not present

## 2021-03-01 DIAGNOSIS — R112 Nausea with vomiting, unspecified: Secondary | ICD-10-CM | POA: Diagnosis present

## 2021-03-01 DIAGNOSIS — K580 Irritable bowel syndrome with diarrhea: Secondary | ICD-10-CM | POA: Insufficient documentation

## 2021-03-01 DIAGNOSIS — K219 Gastro-esophageal reflux disease without esophagitis: Secondary | ICD-10-CM | POA: Insufficient documentation

## 2021-03-01 DIAGNOSIS — D128 Benign neoplasm of rectum: Secondary | ICD-10-CM | POA: Diagnosis not present

## 2021-03-01 DIAGNOSIS — E119 Type 2 diabetes mellitus without complications: Secondary | ICD-10-CM | POA: Diagnosis not present

## 2021-03-01 DIAGNOSIS — E785 Hyperlipidemia, unspecified: Secondary | ICD-10-CM | POA: Insufficient documentation

## 2021-03-01 DIAGNOSIS — K2970 Gastritis, unspecified, without bleeding: Secondary | ICD-10-CM | POA: Diagnosis not present

## 2021-03-01 DIAGNOSIS — K640 First degree hemorrhoids: Secondary | ICD-10-CM | POA: Insufficient documentation

## 2021-03-01 DIAGNOSIS — Z1211 Encounter for screening for malignant neoplasm of colon: Secondary | ICD-10-CM | POA: Insufficient documentation

## 2021-03-01 DIAGNOSIS — Z86010 Personal history of colonic polyps: Secondary | ICD-10-CM | POA: Diagnosis present

## 2021-03-01 HISTORY — DX: Lichen simplex chronicus: L28.0

## 2021-03-01 HISTORY — DX: Gastro-esophageal reflux disease without esophagitis: K21.9

## 2021-03-01 HISTORY — DX: Male erectile dysfunction, unspecified: N52.9

## 2021-03-01 HISTORY — PX: ESOPHAGOGASTRODUODENOSCOPY (EGD) WITH PROPOFOL: SHX5813

## 2021-03-01 HISTORY — PX: COLONOSCOPY WITH PROPOFOL: SHX5780

## 2021-03-01 SURGERY — COLONOSCOPY WITH PROPOFOL
Anesthesia: General

## 2021-03-01 MED ORDER — PROPOFOL 500 MG/50ML IV EMUL
INTRAVENOUS | Status: DC | PRN
  Administered 2021-03-01: 175 ug/kg/min via INTRAVENOUS

## 2021-03-01 MED ORDER — SODIUM CHLORIDE 0.9 % IV SOLN: INTRAVENOUS | Status: DC

## 2021-03-01 MED ORDER — LIDOCAINE HCL (CARDIAC) PF 100 MG/5ML IV SOSY
PREFILLED_SYRINGE | INTRAVENOUS | Status: DC | PRN
  Administered 2021-03-01: 50 mg via INTRAVENOUS

## 2021-03-01 MED ORDER — PROPOFOL 10 MG/ML IV BOLUS
INTRAVENOUS | Status: DC | PRN
  Administered 2021-03-01: 70 mg via INTRAVENOUS

## 2021-03-01 MED ORDER — PROPOFOL 500 MG/50ML IV EMUL
INTRAVENOUS | Status: AC
  Filled 2021-03-01: qty 50

## 2021-03-01 NOTE — Anesthesia Procedure Notes (Signed)
Date/Time: 03/01/2021 8:18 AM Performed by: Johnna Acosta, CRNA Pre-anesthesia Checklist: Patient identified, Emergency Drugs available, Suction available, Patient being monitored and Timeout performed Patient Re-evaluated:Patient Re-evaluated prior to induction Oxygen Delivery Method: Nasal cannula Preoxygenation: Pre-oxygenation with 100% oxygen Induction Type: IV induction

## 2021-03-01 NOTE — Interval H&P Note (Signed)
History and Physical Interval Note:  03/01/2021 8:17 AM  Curtis Farmer  has presented today for surgery, with the diagnosis of PERIUMBILICAL PAIN, NAUSEAAND VOMITING, HX OF ADENOMATOUS POLPY OF COLON, DIARRHEA.  The various methods of treatment have been discussed with the patient and family. After consideration of risks, benefits and other options for treatment, the patient has consented to  Procedure(s) with comments: COLONOSCOPY WITH PROPOFOL (N/A) - DM ESOPHAGOGASTRODUODENOSCOPY (EGD) WITH PROPOFOL (N/A) as a surgical intervention.  The patient's history has been reviewed, patient examined, no change in status, stable for surgery.  I have reviewed the patient's chart and labs.  Questions were answered to the patient's satisfaction.     [name]  Ok to proceed with EGD/colonoscopy

## 2021-03-01 NOTE — Transfer of Care (Signed)
Immediate Anesthesia Transfer of Care Note  Patient: Curtis Farmer  Procedure(s) Performed: COLONOSCOPY WITH PROPOFOL ESOPHAGOGASTRODUODENOSCOPY (EGD) WITH PROPOFOL  Patient Location: PACU  Anesthesia Type:General  Level of Consciousness: sedated  Airway & Oxygen Therapy: Patient Spontanous Breathing and Patient connected to nasal cannula oxygen  Post-op Assessment: Report given to RN and Post -op Vital signs reviewed and stable  Post vital signs: Reviewed and stable  Last Vitals:  Vitals Value Taken Time  BP 118/79 03/01/21 0846  Temp    Pulse 83 03/01/21 0846  Resp 21 03/01/21 0846  SpO2 96 % 03/01/21 0846    Last Pain:  Vitals:   03/01/21 0754  TempSrc: Temporal  PainSc: 0-No pain         Complications: No notable events documented.

## 2021-03-01 NOTE — Anesthesia Preprocedure Evaluation (Signed)
Anesthesia Evaluation  Patient identified by MRN, date of birth, ID band Patient awake    Reviewed: Allergy & Precautions, NPO status , Patient's Chart, lab work & pertinent test results  History of Anesthesia Complications Negative for: history of anesthetic complications  Airway Mallampati: III  TM Distance: <3 FB Neck ROM: full    Dental  (+) Chipped   Pulmonary neg shortness of breath, former smoker,    Pulmonary exam normal        Cardiovascular Exercise Tolerance: Good hypertension, (-) anginaNormal cardiovascular exam     Neuro/Psych negative neurological ROS  negative psych ROS   GI/Hepatic Neg liver ROS, GERD  Medicated and Controlled,  Endo/Other  diabetes, Type 2  Renal/GU negative Renal ROS  negative genitourinary   Musculoskeletal   Abdominal   Peds  Hematology negative hematology ROS (+)   Anesthesia Other Findings Past Medical History: No date: Diabetes mellitus without complication (H[facility]) No date: ED (erectile dysfunction) No date: GERD (gastroesophageal reflux disease) No date: Hyperlipidemia No date: Hypertension No date: Impingement syndrome of shoulder, left No date: Irritable bowel disease No date: Lichen simplex No date: Numbness  Past Surgical History: No date: APPENDECTOMY No date: BACK SURGERY No date: CHOLECYSTECTOMY No date: COLONOSCOPY 01/11/2016: COLONOSCOPY WITH PROPOFOL; N/A     Comment:  Procedure: COLONOSCOPY WITH PROPOFOL;  Surgeon: Lollie Sails, MD;  Location: [facility] ENDOSCOPY;  Service:               Endoscopy;  Laterality: N/A;  BMI    Body Mass Index: 32.38 kg/m      Reproductive/Obstetrics negative OB ROS                             Anesthesia Physical Anesthesia Plan  ASA: 3  Anesthesia Plan: General   Post-op Pain Management:    Induction: Intravenous  PONV Risk Score and Plan: Propofol infusion and  TIVA  Airway Management Planned: Natural Airway and Nasal Cannula  Additional Equipment:   Intra-op Plan:   Post-operative Plan:   Informed Consent: I have reviewed the patients History and Physical, chart, labs and discussed the procedure including the risks, benefits and alternatives for the proposed anesthesia with the patient or authorized representative who has indicated his/her understanding and acceptance.     Dental Advisory Given  Plan Discussed with: Anesthesiologist, CRNA and Surgeon  Anesthesia Plan Comments: (Patient consented for risks of anesthesia including but not limited to:  - adverse reactions to medications - risk of airway placement if required - damage to eyes, teeth, lips or other oral mucosa - nerve damage due to positioning  - sore throat or hoarseness - Damage to heart, brain, nerves, lungs, other parts of body or loss of life  Patient voiced understanding.)        Anesthesia Quick Evaluation

## 2021-03-01 NOTE — Op Note (Signed)
[facility] Gastroenterology Patient Name: Curtis Farmer Procedure Date: 03/01/2021 8:17 AM MRN: [id number] Account #: [id number] Date of Birth: 08-Jan-1951 Admit Type: Outpatient Age: 71 Room: [facility] ENDO ROOM 1 Gender: Male Note Status: Finalized Instrument Name: Upper Endoscope [id number] Procedure:             Upper GI endoscopy Indications:           Nausea with vomiting Providers:             Andrey Farmer MD, MD Medicines:             Monitored Anesthesia Care Complications:         No immediate complications. Estimated blood loss:                         Minimal. Procedure:             Pre-Anesthesia Assessment:                        - Prior to the procedure, a History and Physical was                         performed, and patient medications and allergies were                         reviewed. The patient is competent. The risks and                         benefits of the procedure and the sedation options and                         risks were discussed with the patient. All questions                         were answered and informed consent was obtained.                         Patient identification and proposed procedure were                         verified by the physician, the nurse, the                         anesthesiologist, the anesthetist and the technician                         in the pre-procedure area in the endoscopy suite.                         Mental Status Examination: alert and oriented. Airway                         Examination: normal oropharyngeal airway and neck                         mobility. Respiratory Examination: clear to                         auscultation. CV Examination: normal. Prophylactic  Antibiotics: The patient does not require prophylactic                         antibiotics. Prior Anticoagulants: The patient has                         taken no previous anticoagulant or antiplatelet                          agents. ASA Grade Assessment: II - A patient with mild                         systemic disease. After reviewing the risks and                         benefits, the patient was deemed in satisfactory                         condition to undergo the procedure. The anesthesia                         plan was to use monitored anesthesia care (MAC).                         Immediately prior to administration of medications,                         the patient was re-assessed for adequacy to receive                         sedatives. The heart rate, respiratory rate, oxygen                         saturations, blood pressure, adequacy of pulmonary                         ventilation, and response to care were monitored                         throughout the procedure. The physical status of the                         patient was re-assessed after the procedure.                        After obtaining informed consent, the endoscope was                         passed under direct vision. Throughout the procedure,                         the patient's blood pressure, pulse, and oxygen                         saturations were monitored continuously. The Endoscope                         was introduced through the mouth, and advanced to the  second part of duodenum. The upper GI endoscopy was                         accomplished without difficulty. The patient tolerated                         the procedure well. Findings:      The examined esophagus was normal.      Patchy mild inflammation characterized by erythema was found in the       gastric fundus and in the gastric antrum. Biopsies were taken with a       cold forceps for Helicobacter pylori testing. Estimated blood loss was       minimal.      The examined duodenum was normal. Impression:            - Normal esophagus.                        - Gastritis. Biopsied.                        -  Normal examined duodenum. Recommendation:        - Perform a colonoscopy today.                        - Await pathology results. Procedure Code(s):     --- Professional ---                        [phone], Esophagogastroduodenoscopy, flexible,                         transoral; with biopsy, single or multiple Diagnosis Code(s):     --- Professional ---                        K29.70, Gastritis, unspecified, without bleeding                        R11.2, Nausea with vomiting, unspecified CPT copyright 2019 [employer]. All rights reserved. The codes documented in this report are preliminary and upon coder review may  be revised to meet current compliance requirements. Andrey Farmer MD, MD 03/01/2021 8:43:35 AM Number of Addenda: 0 Note Initiated On: 03/01/2021 8:17 AM Estimated Blood Loss:  Estimated blood loss was minimal.      [facility]

## 2021-03-01 NOTE — Anesthesia Postprocedure Evaluation (Signed)
Anesthesia Post Note  Patient: Curtis Farmer  Procedure(s) Performed: COLONOSCOPY WITH PROPOFOL ESOPHAGOGASTRODUODENOSCOPY (EGD) WITH PROPOFOL  Patient location during evaluation: Endoscopy Anesthesia Type: General Level of consciousness: awake and alert Pain management: pain level controlled Vital Signs Assessment: post-procedure vital signs reviewed and stable Respiratory status: spontaneous breathing, nonlabored ventilation, respiratory function stable and patient connected to nasal cannula oxygen Cardiovascular status: blood pressure returned to baseline and stable Postop Assessment: no apparent nausea or vomiting Anesthetic complications: no   No notable events documented.   Last Vitals:  Vitals:   03/01/21 0855 03/01/21 0915  BP: 131/86 (!) 117/57  Pulse:    Resp:    Temp:    SpO2:      Last Pain:  Vitals:   03/01/21 0915  TempSrc:   PainSc: 0-No pain                 Precious Haws 

## 2021-03-01 NOTE — Op Note (Signed)
[facility] Patient Name: Curtis Farmer Procedure Date: 03/01/2021 7:48 AM MRN: [id number] Account #: [id number] Date of Birth: [birth year] Admit Type: Outpatient Age: 71 Room: [facility] ENDO ROOM 1 Gender: Male Note Status: Finalized Instrument Name: Jasper Riling [id number] Procedure:             Colonoscopy Indications:           High risk colon cancer surveillance: Personal history                         of adenoma with villous component, Last colonoscopy 5                         years ago Providers:             Andrey Farmer MD, MD Medicines:             Monitored Anesthesia Care Complications:         No immediate complications. Estimated blood loss:                         Minimal. Procedure:             Pre-Anesthesia Assessment:                        - Prior to the procedure, a History and Physical was                         performed, and patient medications and allergies were                         reviewed. The patient is competent. The risks and                         benefits of the procedure and the sedation options and                         risks were discussed with the patient. All questions                         were answered and informed consent was obtained.                         Patient identification and proposed procedure were                         verified by the physician, the nurse, the                         anesthesiologist, the anesthetist and the technician                         in the endoscopy suite. Mental Status Examination:                         alert and oriented. Airway Examination: normal                         oropharyngeal airway and neck mobility. Respiratory  Examination: clear to auscultation. CV Examination:                         normal. Prophylactic Antibiotics: The patient does not                         require prophylactic antibiotics. Prior                          Anticoagulants: The patient has taken no previous                         anticoagulant or antiplatelet agents. ASA Grade                         Assessment: II - A patient with mild systemic disease.                         After reviewing the risks and benefits, the patient                         was deemed in satisfactory condition to undergo the                         procedure. The anesthesia plan was to use monitored                         anesthesia care (MAC). Immediately prior to                         administration of medications, the patient was                         re-assessed for adequacy to receive sedatives. The                         heart rate, respiratory rate, oxygen saturations,                         blood pressure, adequacy of pulmonary ventilation, and                         response to care were monitored throughout the                         procedure. The physical status of the patient was                         re-assessed after the procedure.                        After obtaining informed consent, the colonoscope was                         passed under direct vision. Throughout the procedure,                         the patient's blood pressure, pulse, and oxygen  saturations were monitored continuously. The                         Colonoscope was introduced through the anus and                         advanced to the the ileocecal valve. The colonoscopy                         was performed without difficulty. The patient                         tolerated the procedure well. The quality of the bowel                         preparation was fair except the cecum was poor. Findings:      The perianal and digital rectal examinations were normal.      A 3 mm polyp was found in the rectum. The polyp was sessile. The polyp       was removed with a cold snare. Resection and retrieval were complete.       Estimated blood loss was  minimal.      Internal hemorrhoids were found during retroflexion. The hemorrhoids       were Grade I (internal hemorrhoids that do not prolapse).      The exam was otherwise without abnormality on direct and retroflexion       views. Impression:            - One 3 mm polyp in the rectum, removed with a cold                         snare. Resected and retrieved.                        - Internal hemorrhoids.                        - The examination was otherwise normal on direct and                         retroflexion views. Recommendation:        - Discharge patient to home.                        - Resume previous diet.                        - Continue present medications.                        - Await pathology results.                        - Repeat colonoscopy in 1 year because the bowel                         preparation was suboptimal.                        - Return to referring physician as previously  scheduled. Procedure Code(s):     --- Professional ---                        [phone], Colonoscopy, flexible; with removal of                         tumor(s), polyp(s), or other lesion(s) by snare                         technique Diagnosis Code(s):     --- Professional ---                        Z86.010, Personal history of colonic polyps                        K62.1, Rectal polyp                        K64.0, First degree hemorrhoids CPT copyright 2019 American Medical Association. All rights reserved. The codes documented in this report are preliminary and upon coder review may  be revised to meet current compliance requirements. Andrey Farmer MD, MD 03/01/2021 8:48:10 AM Number of Addenda: 0 Note Initiated On: 03/01/2021 7:48 AM Scope Withdrawal Time: 0 hours 6 minutes 55 seconds  Total Procedure Duration: 0 hours 9 minutes 47 seconds  Estimated Blood Loss:  Estimated blood loss was minimal.      [facility]

## 2021-03-01 NOTE — H&P (Signed)
Outpatient short stay form Pre-procedure 03/01/2021  Curtis Rubenstein, MD  Primary Physician: Curtis Hartigan, MD  Reason for visit:  N/V/Abdominal Pain and surveillance  History of present illness:   71 y/o gentleman with history of hypertension and IBS-D here for EGD for recent history of nausea/vomiting and for colonoscopy for history of adenomatous polyps on colonoscopy done in 2014. Last colonoscopy was in 2017 and was benign. No blood thinners. No family history of GI malignancies. History of appendectomy and cholecystectomy.    Current Facility-Administered Medications:    0.9 %  sodium chloride infusion, , Intravenous, Continuous, , Curtis Cork, MD, Last Rate: 20 mL/hr at 03/01/21 0811, New Bag at 03/01/21 0811  Medications Prior to Admission  Medication Sig Dispense Refill Last Dose   albuterol (PROVENTIL HFA;VENTOLIN HFA) 108 (90 Base) MCG/ACT inhaler Inhale 2 puffs into the lungs every 6 (six) hours as needed for wheezing or shortness of breath.   Past Month   esomeprazole (NEXIUM) 20 MG capsule Take by mouth.   Past Week   lisinopril (PRINIVIL,ZESTRIL) 20 MG tablet Take 20 mg by mouth daily.   Past Week   pioglitazone (ACTOS) 45 MG tablet Take 45 mg by mouth daily.   Past Week   simvastatin (ZOCOR) 20 MG tablet Take 20 mg by mouth daily.   Past Week   colestipol (COLESTID) 1 g tablet Take 1 g by mouth 2 (two) times daily.      dicyclomine (BENTYL) 20 MG tablet Take 1 tablet (20 mg total) by mouth every 8 (eight) hours as needed for spasms (Abdominal cramping). 20 tablet 0    loperamide (IMODIUM) 2 MG capsule Take 1 capsule (2 mg total) by mouth as needed for diarrhea or loose stools. 30 capsule 0    metFORMIN (GLUCOPHAGE) 1000 MG tablet Take 1,000 mg by mouth 2 (two) times daily with a meal.      ondansetron (ZOFRAN ODT) 4 MG disintegrating tablet Take 1 tablet (4 mg total) by mouth every 6 (six) hours as needed for nausea or vomiting. 20 tablet 0    sitaGLIPtin  (JANUVIA) 100 MG tablet Take 100 mg by mouth daily.      tadalafil (CIALIS) 20 MG tablet Take 1 tablet (20 mg total) by mouth daily as needed for erectile dysfunction. 30 tablet 11      Allergies  Allergen Reactions   Pecan Nut (Diagnostic) Other (See Comments)   Penicillin V Potassium Swelling     Past Medical History:  Diagnosis Date   Diabetes mellitus without complication (HCC)    ED (erectile dysfunction)    GERD (gastroesophageal reflux disease)    Hyperlipidemia    Hypertension    Impingement syndrome of shoulder, left    Irritable bowel disease    Lichen simplex    Numbness     Review of systems:  Otherwise negative.    Physical Exam  Gen: Alert, oriented. Appears stated age.  HEENT: PERRLA. Lungs: No respiratory distress CV: RRR Abd: soft, benign, no massea Ext: No edema    Planned procedures: Proceed with EGD/colonoscopy. The patient understands the nature of the planned procedure, indications, risks, alternatives and potential complications including but not limited to bleeding, infection, perforation, damage to internal organs and possible oversedation/side effects from anesthesia. The patient agrees and gives consent to proceed.  Please refer to procedure notes for findings, recommendations and patient disposition/instructions.     Curtis Rubenstein, MD [facility]

## 2021-03-02 ENCOUNTER — Encounter: Payer: Self-pay | Admitting: Gastroenterology

## 2021-03-02 LAB — SURGICAL PATHOLOGY

## 2021-03-08 ENCOUNTER — Other Ambulatory Visit
Admission: RE | Admit: 2021-03-08 | Discharge: 2021-03-08 | Disposition: A | Discharge status: Home / Self Care | Payer: Medicare PPO | Attending: Urology | Admitting: Urology

## 2021-03-08 ENCOUNTER — Other Ambulatory Visit: Payer: Self-pay

## 2021-03-08 ENCOUNTER — Ambulatory Visit: Payer: Medicare PPO | Admitting: Urology

## 2021-03-08 ENCOUNTER — Encounter: Payer: Self-pay | Admitting: Urology

## 2021-03-08 VITALS — BP 153/83 | HR 91 | Ht 76.0 in | Wt 269.0 lb

## 2021-03-08 DIAGNOSIS — R972 Elevated prostate specific antigen [PSA]: Secondary | ICD-10-CM | POA: Diagnosis present

## 2021-03-08 DIAGNOSIS — N529 Male erectile dysfunction, unspecified: Secondary | ICD-10-CM | POA: Diagnosis not present

## 2021-03-08 NOTE — Progress Notes (Signed)
° °  03/08/2021 9:51 AM   Curtis Farmer [birth year] [id number]  Reason for visit: Follow up elevated PSA, ED  HPI: 71 year old male with no family history of prostate cancer who was originally found to have an elevated PSA of 6.9 and underwent a prostate biopsy in February 2020 that showed a 64 g prostate with a PSA density of 0.11 and showed only benign prostatic tissue.  Follow-up PSA in June 2020 was stable at 5.2, and even lower at 4.7 in February 2021.  He denies significant urinary symptoms, and uses 20 mg Cialis on demand for ED.  PSA increased to 7.44 in August 2022, and on repeat in September 2022 continued to rise at 9.44.  At that point I recommended a prostate MRI.  I personally viewed and interpreted the prostate MRI on 12/20/2020 that shows a small PI-RADS 3 lesion in the anterior aspect of the left transition zone that measured 1.7 cm, but no evidence of high-grade carcinoma.  He opted for a repeat PSA in 3 months with reflex to free.  PSA was drawn today and is pending.  He continues to take the Cialis 20 mg on demand for ED with good results.  We again reviewed options for his elevated PSA.  We will plan to call with PSA results from today, and if remains elevated greater than 9 I recommended MRI fusion biopsy in [place].  If PSA below 7, I think it is safe to continue screening every 6 to 12 months.  PSA today, call with results  [name], MD  [place] [address], [place], [place] [address] [phone]

## 2021-03-08 NOTE — Patient Instructions (Signed)

## 2021-03-10 LAB — PSA (REFLEX TO FREE) (SERIAL): Prostate Specific Ag, Serum: 6.7 ng/mL — ABNORMAL HIGH (ref 0.0–4.0)

## 2021-03-10 LAB — FPSA% REFLEX
% FREE PSA: 12.2 %
PSA, FREE: 0.82 ng/mL

## 2021-10-24 DIAGNOSIS — R0989 Other specified symptoms and signs involving the circulatory and respiratory systems: Secondary | ICD-10-CM | POA: Diagnosis not present

## 2021-10-24 DIAGNOSIS — R062 Wheezing: Secondary | ICD-10-CM | POA: Diagnosis not present

## 2021-10-24 DIAGNOSIS — R053 Chronic cough: Secondary | ICD-10-CM | POA: Diagnosis not present

## 2021-10-24 DIAGNOSIS — Z03818 Encounter for observation for suspected exposure to other biological agents ruled out: Secondary | ICD-10-CM | POA: Diagnosis not present

## 2021-11-29 DIAGNOSIS — E7800 Pure hypercholesterolemia, unspecified: Secondary | ICD-10-CM | POA: Diagnosis not present

## 2021-11-29 DIAGNOSIS — E119 Type 2 diabetes mellitus without complications: Secondary | ICD-10-CM | POA: Diagnosis not present

## 2021-12-06 DIAGNOSIS — Z23 Encounter for immunization: Secondary | ICD-10-CM | POA: Diagnosis not present

## 2021-12-06 DIAGNOSIS — E7800 Pure hypercholesterolemia, unspecified: Secondary | ICD-10-CM | POA: Diagnosis not present

## 2021-12-06 DIAGNOSIS — K219 Gastro-esophageal reflux disease without esophagitis: Secondary | ICD-10-CM | POA: Diagnosis not present

## 2021-12-06 DIAGNOSIS — K580 Irritable bowel syndrome with diarrhea: Secondary | ICD-10-CM | POA: Diagnosis not present

## 2021-12-06 DIAGNOSIS — I10 Essential (primary) hypertension: Secondary | ICD-10-CM | POA: Diagnosis not present

## 2021-12-06 DIAGNOSIS — E119 Type 2 diabetes mellitus without complications: Secondary | ICD-10-CM | POA: Diagnosis not present

## 2021-12-06 DIAGNOSIS — Z1331 Encounter for screening for depression: Secondary | ICD-10-CM | POA: Diagnosis not present

## 2021-12-06 DIAGNOSIS — Z0000 Encounter for general adult medical examination without abnormal findings: Secondary | ICD-10-CM | POA: Diagnosis not present

## 2021-12-06 DIAGNOSIS — N529 Male erectile dysfunction, unspecified: Secondary | ICD-10-CM | POA: Diagnosis not present

## 2021-12-28 ENCOUNTER — Ambulatory Visit: Payer: Medicare PPO | Admitting: Urology

## 2021-12-28 ENCOUNTER — Encounter: Payer: Self-pay | Admitting: Urology

## 2021-12-28 VITALS — BP 143/80 | HR 84 | Ht 75.0 in | Wt 245.0 lb

## 2021-12-28 DIAGNOSIS — R972 Elevated prostate specific antigen [PSA]: Secondary | ICD-10-CM

## 2021-12-28 NOTE — Progress Notes (Signed)
   12/28/2021 10:46 AM   Curtis Farmer [birth year] [id number]  Reason for visit: Follow up elevated PSA, ED  HPI: 71 year old male with no family history of prostate cancer who was originally found to have an elevated PSA of 6.9 and underwent a prostate biopsy in February 2020 that showed a 64 g prostate with a PSA density of 0.11 and showed only benign prostatic tissue.  Follow-up PSA in June 2020 was stable at 5.2, and even lower at 4.7 in February 2021.  He denies significant urinary symptoms, and uses 20 mg Cialis on demand for ED.  PSA increased to 7.44 in August 2022, and on repeat in September 2022 continued to rise at 9.44.  At that point I recommended a prostate MRI.  Prostate MRI on 12/20/2020 that shows a small PI-RADS 3 lesion in the anterior aspect of the left transition zone that measured 1.7 cm, but no evidence of high-grade carcinoma.  He opted for a repeat PSA in 3 months with reflex to free, and PSA decreased to 6.7(12% free), and he opted for repeat PSA in 9 months.  It sounds like he made an appointment today because he was concerned that his PSA was rising.  I do not see any recent PSA values to review, and I think he was looking at the [facility] free PSA from the January labs showing 12% free.  Reassurance was provided that most recent PSA from January 2023 of 6.7 is lower than his original PSA in 6.9 when biopsy was negative, PSA density was reassuring at 0.11.  In addition, his relatively benign prostate MRI from October 2022 was also reassuring.  RTC 6 months lab visit PSA reflex to free, call with results   [name], [place] [address] [place], [place] [address] [phone]

## 2022-02-11 IMAGING — MR MR PROSTATE WO/W CM
56 series · 56 of 56 positions shown · IV contrast (10ml Gadavist)
Comparison: None.

CLINICAL DATA: 69-year-old male with elevated PSA equal 9.4 on
11/16/2020.

EXAM:
MR PROSTATE WITHOUT AND WITH CONTRAST
TECHNIQUE: Multiplanar multisequence MRI images were obtained of the pelvis
centered about the prostate. Pre and post contrast images were
obtained.
CONTRAST:  10mL GADAVIST GADOBUTROL 1 MMOL/ML IV SOLN

[Series 3: ax in&out whole · axial · 6.0mm · 0.74mm/px · 1 of 35 slices shown (1 of 2)]
[im 1/35]
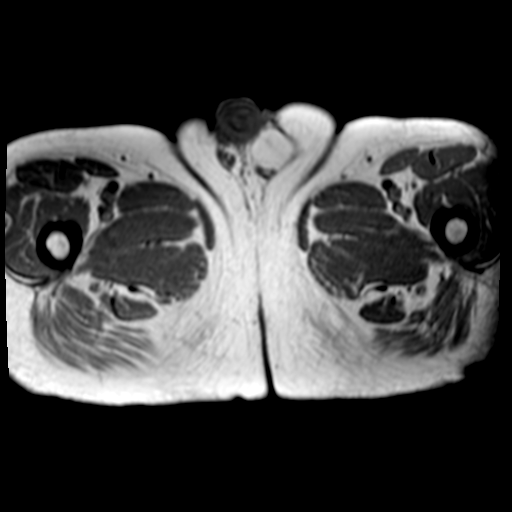

[Series 3: ax in&out whole · axial · 6.0mm · 0.74mm/px · 1 of 35 slices shown (2 of 2)]
[im 1/35]
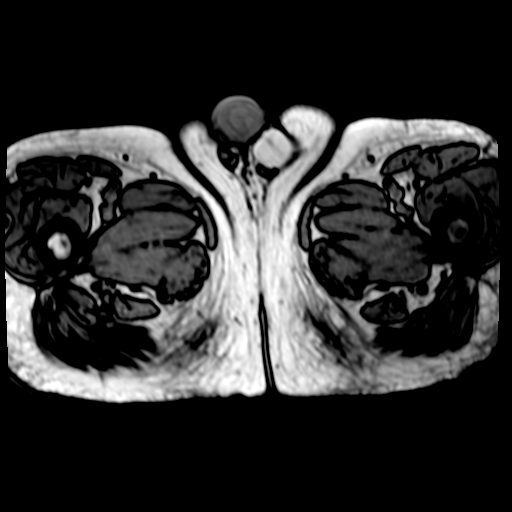

[Series 4: T2 · coronal · 3.0mm · 0.70mm/px · 1 of 35 slices shown (1 of 3)]
[im 1/35]
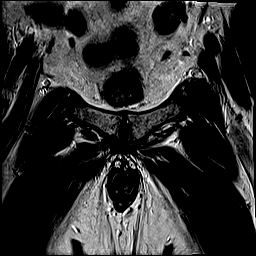

[Series 5: T2 · axial · 3.0mm · 0.56mm/px · 1 of 30 slices shown (2 of 3)]
[im 1/30]
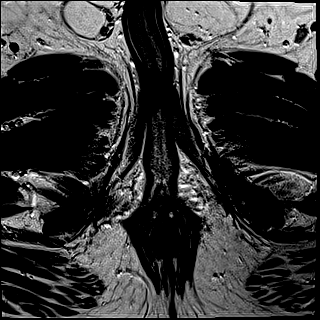

[Series 6: DWI · axial · 3.0mm · 0.86mm/px · 1 of 87 slices shown (1 of 3)]
[im 1/87]
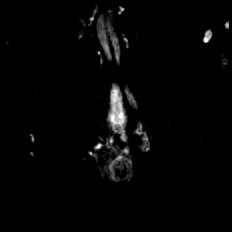

[Series 7: DWI · axial · 3.0mm · 0.86mm/px · 1 of 29 slices shown (2 of 3)]
[im 1/29]
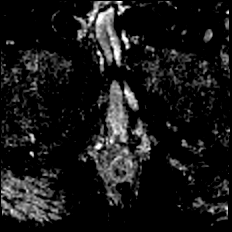

[Series 8: DWI · axial · 3.0mm · 0.86mm/px · 1 of 29 slices shown (3 of 3)]
[im 1/29]
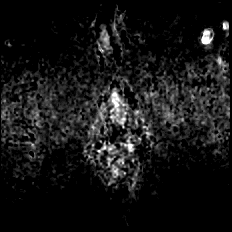

[Series 9: T2 · axial · 1.0mm · 1.04mm/px · 1 of 88 slices shown (3 of 3)]
[im 1/88]
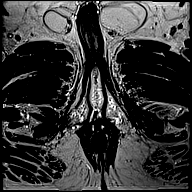

[Series 10: T1 · axial · 3.0mm · 1.15mm/px · 1 of 30 slices shown (1 of 48)]
[im 1/30]
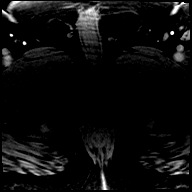

[Series 11: T1 · axial · 3.0mm · 1.15mm/px · 1 of 30 slices shown (2 of 48)]
[im 1/30]
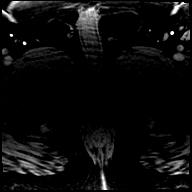

[Series 12: T1 · axial · 3.0mm · 1.15mm/px · 1 of 30 slices shown (3 of 48)]
[im 1/30]
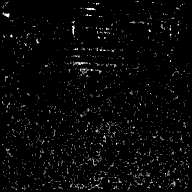

[Series 13: T1 · axial · 3.0mm · 1.15mm/px · 1 of 30 slices shown (4 of 48)]
[im 1/30]
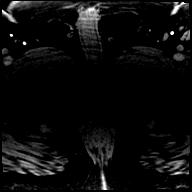

[Series 14: T1 · axial · 3.0mm · 1.15mm/px · 1 of 30 slices shown (5 of 48)]
[im 1/30]
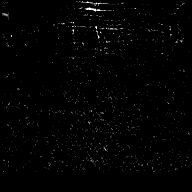

[Series 15: T1 · axial · 3.0mm · 1.15mm/px · 1 of 30 slices shown (6 of 48)]
[im 1/30]
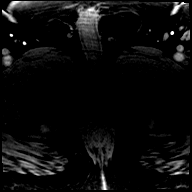

[Series 16: T1 · axial · 3.0mm · 1.15mm/px · 1 of 30 slices shown (7 of 48)]
[im 1/30]
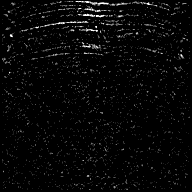

[Series 17: T1 · axial · 3.0mm · 1.15mm/px · 1 of 30 slices shown (8 of 48)]
[im 1/30]
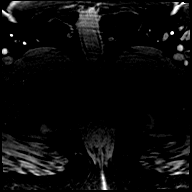

[Series 18: T1 · axial · 3.0mm · 1.15mm/px · 1 of 30 slices shown (9 of 48)]
[im 1/30]
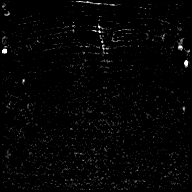

[Series 19: T1 · axial · 3.0mm · 1.15mm/px · 1 of 30 slices shown (10 of 48)]
[im 1/30]
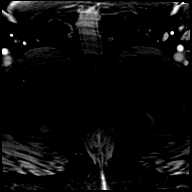

[Series 20: T1 · axial · 3.0mm · 1.15mm/px · 1 of 30 slices shown (11 of 48)]
[im 1/30]
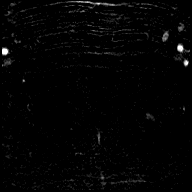

[Series 21: T1 · axial · 3.0mm · 1.15mm/px · 1 of 30 slices shown (12 of 48)]
[im 1/30]
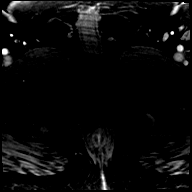

[Series 22: T1 · axial · 3.0mm · 1.15mm/px · 1 of 30 slices shown (13 of 48)]
[im 1/30]
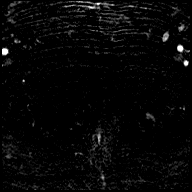

[Series 23: T1 · axial · 3.0mm · 1.15mm/px · 1 of 30 slices shown (14 of 48)]
[im 1/30]
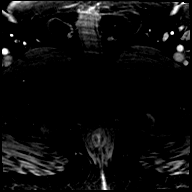

[Series 24: T1 · axial · 3.0mm · 1.15mm/px · 1 of 30 slices shown (15 of 48)]
[im 1/30]
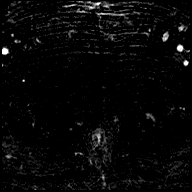

[Series 25: T1 · axial · 3.0mm · 1.15mm/px · 1 of 30 slices shown (16 of 48)]
[im 1/30]
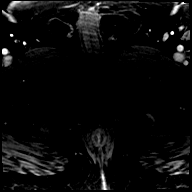

[Series 26: T1 · axial · 3.0mm · 1.15mm/px · 1 of 30 slices shown (17 of 48)]
[im 1/30]
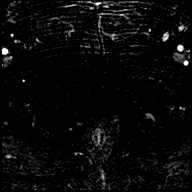

[Series 27: T1 · axial · 3.0mm · 1.15mm/px · 1 of 30 slices shown (18 of 48)]
[im 1/30]
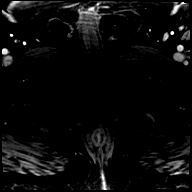

[Series 28: T1 · axial · 3.0mm · 1.15mm/px · 1 of 30 slices shown (19 of 48)]
[im 1/30]
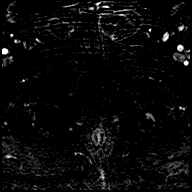

[Series 29: T1 · axial · 3.0mm · 1.15mm/px · 1 of 30 slices shown (20 of 48)]
[im 1/30]
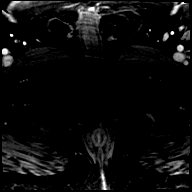

[Series 30: T1 · axial · 3.0mm · 1.15mm/px · 1 of 30 slices shown (21 of 48)]
[im 1/30]
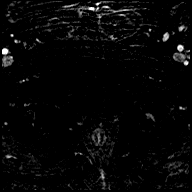

[Series 31: T1 · axial · 3.0mm · 1.15mm/px · 1 of 30 slices shown (22 of 48)]
[im 1/30]
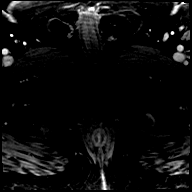

[Series 32: T1 · axial · 3.0mm · 1.15mm/px · 1 of 30 slices shown (23 of 48)]
[im 1/30]
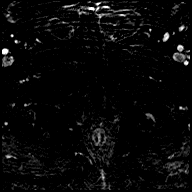

[Series 33: T1 · axial · 3.0mm · 1.15mm/px · 1 of 30 slices shown (24 of 48)]
[im 1/30]
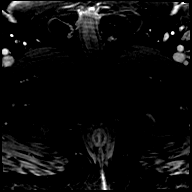

[Series 34: T1 · axial · 3.0mm · 1.15mm/px · 1 of 30 slices shown (25 of 48)]
[im 1/30]
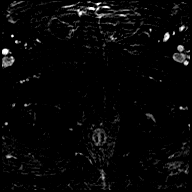

[Series 35: T1 · axial · 3.0mm · 1.15mm/px · 1 of 30 slices shown (26 of 48)]
[im 1/30]
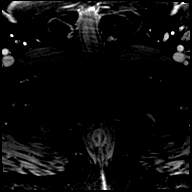

[Series 36: T1 · axial · 3.0mm · 1.15mm/px · 1 of 30 slices shown (27 of 48)]
[im 1/30]
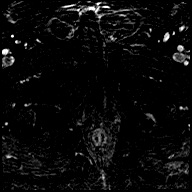

[Series 37: T1 · axial · 3.0mm · 1.15mm/px · 1 of 30 slices shown (28 of 48)]
[im 1/30]
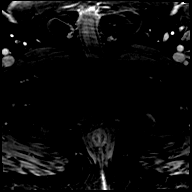

[Series 38: T1 · axial · 3.0mm · 1.15mm/px · 1 of 29 slices shown (29 of 48)]
[im 1/29]
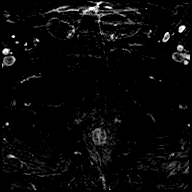

[Series 39: T1 · axial · 3.0mm · 1.15mm/px · 1 of 30 slices shown (30 of 48)]
[im 1/30]
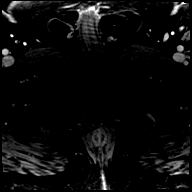

[Series 40: T1 · axial · 3.0mm · 1.15mm/px · 1 of 30 slices shown (31 of 48)]
[im 1/30]
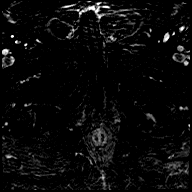

[Series 41: T1 · axial · 3.0mm · 1.15mm/px · 1 of 30 slices shown (32 of 48)]
[im 1/30]
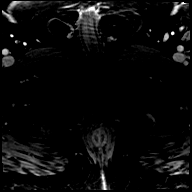

[Series 42: T1 · axial · 3.0mm · 1.15mm/px · 1 of 30 slices shown (33 of 48)]
[im 1/30]
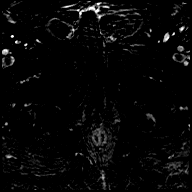

[Series 43: T1 · axial · 3.0mm · 1.15mm/px · 1 of 30 slices shown (34 of 48)]
[im 1/30]
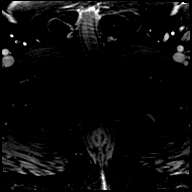

[Series 44: T1 · axial · 3.0mm · 1.15mm/px · 1 of 30 slices shown (35 of 48)]
[im 1/30]
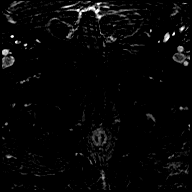

[Series 45: T1 · axial · 3.0mm · 1.15mm/px · 1 of 30 slices shown (36 of 48)]
[im 1/30]
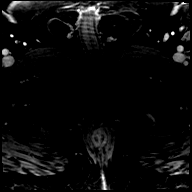

[Series 46: T1 · axial · 3.0mm · 1.15mm/px · 1 of 30 slices shown (37 of 48)]
[im 1/30]
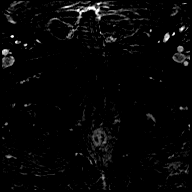

[Series 47: T1 · axial · 3.0mm · 1.15mm/px · 1 of 30 slices shown (38 of 48)]
[im 1/30]
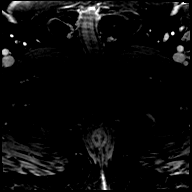

[Series 48: T1 · axial · 3.0mm · 1.15mm/px · 1 of 30 slices shown (39 of 48)]
[im 1/30]
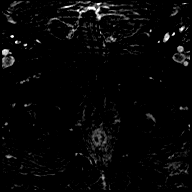

[Series 49: T1 · axial · 3.0mm · 1.15mm/px · 1 of 30 slices shown (40 of 48)]
[im 1/30]
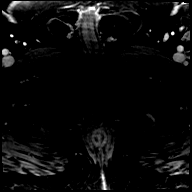

[Series 50: T1 · axial · 3.0mm · 1.15mm/px · 1 of 30 slices shown (41 of 48)]
[im 1/30]
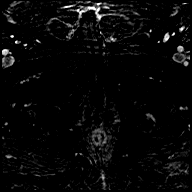

[Series 51: T1 · axial · 3.0mm · 1.15mm/px · 1 of 30 slices shown (42 of 48)]
[im 1/30]
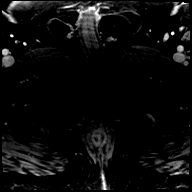

[Series 52: T1 · axial · 3.0mm · 1.15mm/px · 1 of 30 slices shown (43 of 48)]
[im 1/30]
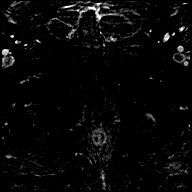

[Series 53: T1 · axial · 3.0mm · 1.15mm/px · 1 of 30 slices shown (44 of 48)]
[im 1/30]
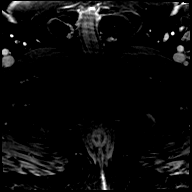

[Series 54: T1 · axial · 3.0mm · 1.15mm/px · 1 of 30 slices shown (45 of 48)]
[im 1/30]
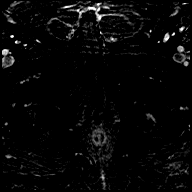

[Series 55: T1 · axial · 3.0mm · 1.15mm/px · 1 of 30 slices shown (46 of 48)]
[im 1/30]
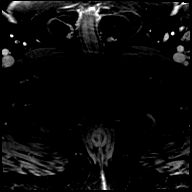

[Series 56: T1 · axial · 3.0mm · 1.15mm/px · 1 of 30 slices shown (47 of 48)]
[im 1/30]
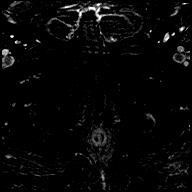

[Series 57: T1 · axial · 3.0mm · 1.15mm/px · 1 of 30 slices shown (48 of 48)]
[im 1/30]
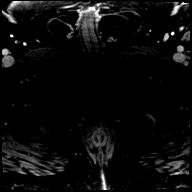

[56 of 56 positions shown; findings below may reference images not displayed]

FINDINGS: Prostate: No focal lesion identified within the peripheral zone on
T2 weighted imaging (series 5). No foci restricted diffusion
identified within the peripheral zone.

The transitional zone is enlarged by well capsulated nodules.

In the anterior aspect of the LEFT transitional zone there is a
region of restricted diffusion measuring 1.7 x 0.8 cm (image
17/series 7). This corresponds to poorly marginated low signal
intensity tissue on T2 weighted imaging (image 17/series 5 )

Volume: 5.2 x 3.7 x 3.8 cm (volume = 38 cm^3)

Transcapsular spread:  Absent

Seminal vesicle involvement: Absent

Neurovascular bundle involvement: Absent

Pelvic adenopathy: Absent

Bone metastasis: Absent

Other findings: Absent
IMPRESSION: 1. Region of ill-defined tissue in the anterior LEFT transitional
zone with restricted diffusion. PI-RADS: 3. IP #1. (Dynacad 3D post
processing performed).
2. No high-grade carcinoma identified in the peripheral zone.
PI-RADS: 2

## 2022-06-22 DIAGNOSIS — E7800 Pure hypercholesterolemia, unspecified: Secondary | ICD-10-CM | POA: Diagnosis not present

## 2022-06-22 DIAGNOSIS — E119 Type 2 diabetes mellitus without complications: Secondary | ICD-10-CM | POA: Diagnosis not present

## 2022-06-29 ENCOUNTER — Other Ambulatory Visit: Payer: Medicare PPO

## 2022-06-29 DIAGNOSIS — R972 Elevated prostate specific antigen [PSA]: Secondary | ICD-10-CM

## 2022-06-29 DIAGNOSIS — R252 Cramp and spasm: Secondary | ICD-10-CM | POA: Diagnosis not present

## 2022-06-29 DIAGNOSIS — K219 Gastro-esophageal reflux disease without esophagitis: Secondary | ICD-10-CM | POA: Diagnosis not present

## 2022-06-29 DIAGNOSIS — E119 Type 2 diabetes mellitus without complications: Secondary | ICD-10-CM | POA: Diagnosis not present

## 2022-06-29 DIAGNOSIS — N529 Male erectile dysfunction, unspecified: Secondary | ICD-10-CM | POA: Diagnosis not present

## 2022-06-29 DIAGNOSIS — K580 Irritable bowel syndrome with diarrhea: Secondary | ICD-10-CM | POA: Diagnosis not present

## 2022-06-29 DIAGNOSIS — E7800 Pure hypercholesterolemia, unspecified: Secondary | ICD-10-CM | POA: Diagnosis not present

## 2022-06-29 DIAGNOSIS — I10 Essential (primary) hypertension: Secondary | ICD-10-CM | POA: Diagnosis not present

## 2022-06-29 DIAGNOSIS — G4700 Insomnia, unspecified: Secondary | ICD-10-CM | POA: Diagnosis not present

## 2022-06-30 LAB — PSA TOTAL (REFLEX TO FREE): Prostate Specific Ag, Serum: 11.7 ng/mL — ABNORMAL HIGH (ref 0.0–4.0)

## 2022-07-04 ENCOUNTER — Telehealth: Payer: Self-pay

## 2022-07-04 NOTE — Telephone Encounter (Signed)
Called pt informed him of information below. Pt voiced understanding, appt scheduled.

## 2022-07-04 NOTE — Telephone Encounter (Signed)
-----   Message from Sondra Come, MD sent at 07/04/2022  8:07 AM EDT ----- PSA bumped up again to 11.7, recommend appointment in person to discuss options  Legrand Rams, MD 07/04/2022

## 2022-07-25 ENCOUNTER — Encounter: Payer: Self-pay | Admitting: Urology

## 2022-07-25 ENCOUNTER — Ambulatory Visit: Payer: Medicare PPO | Admitting: Urology

## 2022-07-25 VITALS — BP 114/71 | HR 73 | Ht 75.0 in | Wt 236.0 lb

## 2022-07-25 DIAGNOSIS — R972 Elevated prostate specific antigen [PSA]: Secondary | ICD-10-CM | POA: Diagnosis not present

## 2022-07-25 NOTE — Patient Instructions (Signed)

## 2022-07-25 NOTE — Progress Notes (Signed)
   07/25/2022 1:46 PM   Curtis Farmer 03/11/1950 [id number]  Reason for visit: Follow up elevated PSA, ED  HPI: 72 year old male with no family history of prostate cancer with long history of elevated and variable PSA.  Most recent PSA from 06/29/2022 increased to 11.7, and he is here today to discuss options.  He was originally found to have an elevated PSA of 6.9 and underwent a prostate biopsy in February 2020 that showed a 64 g prostate, PSA density of 0.11, and showed only benign prostatic tissue.  Follow-up PSA in June 2020 was stable at 5.2, even lower at 4.7 in February 2021.  PSA then increased to 7.44 in August 2022, and on repeat in September 2022 continued rise at 9.44.  Prostate MRI on 12/20/2020 showed a small PI-RADS 3 lesion in the anterior aspect of the left transition zone that measured 1.7 cm, but no evidence of high-grade carcinoma.  He opted for a repeat PSA in 3 months with reflex to free, and PSA decreased to 6.7(12% free), and he opted to continue to monitor the PSA.  We reviewed the AUA guidelines regarding PSA screening, risks and benefits, as well as false elevations in PSA secondary to BPH, infection, inflammation, recent ejaculations and other possible causes.  Options would include repeat PSA with reflex to free, repeat prostate MRI, 4K score, or prostate biopsy.  Using shared decision making he opted for repeat PSA with reflex to free, and if remains elevated consider either a 4K score or a MRI fusion biopsy.  Call with PSA results, if remains significantly elevated recommend either a 4K score or MRI fusion biopsy.  If PSA decreased to <8 likely continue PSA monitoring every 6 months  Sondra Come, MD  [facility] [address], [address] [place], [place] [address] [phone]

## 2022-07-28 ENCOUNTER — Other Ambulatory Visit
Admission: RE | Admit: 2022-07-28 | Discharge: 2022-07-28 | Disposition: A | Discharge status: Home / Self Care | Payer: Medicare PPO | Attending: Urology | Admitting: Urology

## 2022-07-28 DIAGNOSIS — R972 Elevated prostate specific antigen [PSA]: Secondary | ICD-10-CM | POA: Diagnosis not present

## 2022-07-31 LAB — PSA (REFLEX TO FREE) (SERIAL): Prostate Specific Ag, Serum: 12.1 ng/mL — ABNORMAL HIGH (ref 0.0–4.0)

## 2022-08-02 ENCOUNTER — Telehealth: Payer: Self-pay

## 2022-08-02 NOTE — Telephone Encounter (Signed)
Called pt informed him of the information below, pt voiced understanding. Pt states that he is on vacation and would like to call us back when he returns to schedule. Bx instructions sent via mychart, pt is not on blood thinners, no clearance required.

## 2022-08-02 NOTE — Telephone Encounter (Signed)
-----   Message from Sondra Come, MD sent at 08/01/2022 12:45 PM EDT ----- PSA remains elevated at 12.1.  Would recommend MRI fusion biopsy as discussed in clinic.  If he defers biopsy, would recommend a 4K score for further risk stratification  Legrand Rams, MD 08/01/2022

## 2022-08-24 DIAGNOSIS — H04223 Epiphora due to insufficient drainage, bilateral lacrimal glands: Secondary | ICD-10-CM | POA: Diagnosis not present

## 2022-08-24 DIAGNOSIS — Z0100 Encounter for examination of eyes and vision without abnormal findings: Secondary | ICD-10-CM | POA: Diagnosis not present

## 2022-08-24 DIAGNOSIS — E119 Type 2 diabetes mellitus without complications: Secondary | ICD-10-CM | POA: Diagnosis not present

## 2022-08-24 DIAGNOSIS — H43813 Vitreous degeneration, bilateral: Secondary | ICD-10-CM | POA: Diagnosis not present

## 2022-08-24 DIAGNOSIS — H2513 Age-related nuclear cataract, bilateral: Secondary | ICD-10-CM | POA: Diagnosis not present

## 2022-08-30 ENCOUNTER — Encounter: Payer: Self-pay | Admitting: *Deleted

## 2022-09-07 ENCOUNTER — Encounter: Payer: Self-pay | Admitting: *Deleted

## 2022-09-08 ENCOUNTER — Encounter: Payer: Self-pay | Admitting: *Deleted

## 2022-09-08 ENCOUNTER — Ambulatory Visit: Payer: Medicare PPO | Admitting: Anesthesiology

## 2022-09-08 ENCOUNTER — Ambulatory Visit
Admission: RE | Admit: 2022-09-08 | Discharge: 2022-09-08 | Disposition: A | Discharge status: Home / Self Care | Payer: Medicare PPO | Attending: Gastroenterology | Admitting: Gastroenterology

## 2022-09-08 ENCOUNTER — Other Ambulatory Visit: Payer: Self-pay

## 2022-09-08 ENCOUNTER — Encounter
Admission: RE | Disposition: A | Discharge status: Home / Self Care | Payer: Self-pay | Source: Home / Self Care | Attending: Gastroenterology

## 2022-09-08 DIAGNOSIS — Z1211 Encounter for screening for malignant neoplasm of colon: Secondary | ICD-10-CM | POA: Diagnosis not present

## 2022-09-08 DIAGNOSIS — Z9049 Acquired absence of other specified parts of digestive tract: Secondary | ICD-10-CM | POA: Diagnosis not present

## 2022-09-08 DIAGNOSIS — K589 Irritable bowel syndrome without diarrhea: Secondary | ICD-10-CM | POA: Insufficient documentation

## 2022-09-08 DIAGNOSIS — I10 Essential (primary) hypertension: Secondary | ICD-10-CM | POA: Diagnosis not present

## 2022-09-08 DIAGNOSIS — Z09 Encounter for follow-up examination after completed treatment for conditions other than malignant neoplasm: Secondary | ICD-10-CM | POA: Diagnosis not present

## 2022-09-08 DIAGNOSIS — Z87891 Personal history of nicotine dependence: Secondary | ICD-10-CM | POA: Diagnosis not present

## 2022-09-08 DIAGNOSIS — E119 Type 2 diabetes mellitus without complications: Secondary | ICD-10-CM | POA: Diagnosis not present

## 2022-09-08 DIAGNOSIS — K219 Gastro-esophageal reflux disease without esophagitis: Secondary | ICD-10-CM | POA: Insufficient documentation

## 2022-09-08 DIAGNOSIS — K640 First degree hemorrhoids: Secondary | ICD-10-CM | POA: Diagnosis not present

## 2022-09-08 DIAGNOSIS — Z86010 Personal history of colonic polyps: Secondary | ICD-10-CM | POA: Insufficient documentation

## 2022-09-08 DIAGNOSIS — K5730 Diverticulosis of large intestine without perforation or abscess without bleeding: Secondary | ICD-10-CM | POA: Insufficient documentation

## 2022-09-08 DIAGNOSIS — Z7984 Long term (current) use of oral hypoglycemic drugs: Secondary | ICD-10-CM | POA: Diagnosis not present

## 2022-09-08 DIAGNOSIS — K649 Unspecified hemorrhoids: Secondary | ICD-10-CM | POA: Diagnosis not present

## 2022-09-08 HISTORY — PX: COLONOSCOPY WITH PROPOFOL: SHX5780

## 2022-09-08 LAB — GLUCOSE, CAPILLARY: Glucose-Capillary: 147 mg/dL — ABNORMAL HIGH (ref 70–99)

## 2022-09-08 SURGERY — COLONOSCOPY WITH PROPOFOL
Anesthesia: General

## 2022-09-08 MED ORDER — LIDOCAINE HCL (CARDIAC) PF 100 MG/5ML IV SOSY
PREFILLED_SYRINGE | INTRAVENOUS | Status: DC | PRN
  Administered 2022-09-08: 100 mg via INTRAVENOUS

## 2022-09-08 MED ORDER — SODIUM CHLORIDE 0.9 % IV SOLN: INTRAVENOUS | Status: DC

## 2022-09-08 MED ORDER — PROPOFOL 10 MG/ML IV BOLUS
INTRAVENOUS | Status: DC | PRN
  Administered 2022-09-08: 50 mg via INTRAVENOUS
  Administered 2022-09-08: 150 ug/kg/min via INTRAVENOUS

## 2022-09-08 NOTE — Interval H&P Note (Signed)
History and Physical Interval Note:  09/08/2022 9:45 AM  Curtis Farmer  has presented today for surgery, with the diagnosis of Z86.010 - History of colon polyps.  The various methods of treatment have been discussed with the patient and family. After consideration of risks, benefits and other options for treatment, the patient has consented to  Procedure(s): COLONOSCOPY WITH PROPOFOL (N/A) as a surgical intervention.  The patient's history has been reviewed, patient examined, no change in status, stable for surgery.  I have reviewed the patient's chart and labs.  Questions were answered to the patient's satisfaction.     Regis Bill  Ok to proceed with colonoscopy

## 2022-09-08 NOTE — H&P (Signed)
Outpatient short stay form Pre-procedure 09/08/2022  Regis Bill, MD  Primary Physician: Marina Goodell, MD  Reason for visit:  Surveillance  History of present illness:    72 y/o gentleman with history of hypertension, DM II, and IBS here for colonoscopy due to suboptimal prep on colonoscopy done in Jan 2023. He did have a small TA on that colonoscopy. No blood thinners. No family history of GI malignancies. History of cholecystectomy and appendectomy.    Current Facility-Administered Medications:    0.9 %  sodium chloride infusion, , Intravenous, Continuous, , Rossie Muskrat, MD, Last Rate: 20 mL/hr at 09/08/22 0940, Continued from Pre-op at 09/08/22 0940  Medications Prior to Admission  Medication Sig Dispense Refill Last Dose   colestipol (COLESTID) 1 g tablet Take 1 g by mouth 2 (two) times daily.   Past Week   lisinopril (PRINIVIL,ZESTRIL) 20 MG tablet Take 20 mg by mouth daily.   09/08/2022   metFORMIN (GLUCOPHAGE) 1000 MG tablet Take 1,000 mg by mouth 2 (two) times daily with a meal.   Past Week   pioglitazone (ACTOS) 45 MG tablet Take 45 mg by mouth daily.   Past Week   Semaglutide,0.25 or 0.5MG /DOS, (OZEMPIC, 0.25 OR 0.5 MG/DOSE,) 2 MG/1.5ML SOPN Inject into the skin.   08/28/2022   simvastatin (ZOCOR) 20 MG tablet Take 20 mg by mouth daily.   Past Week   traZODone (DESYREL) 50 MG tablet Take 50 mg by mouth at bedtime.   Past Week   albuterol (PROVENTIL HFA;VENTOLIN HFA) 108 (90 Base) MCG/ACT inhaler Inhale 2 puffs into the lungs every 6 (six) hours as needed for wheezing or shortness of breath.      esomeprazole (NEXIUM) 20 MG capsule Take by mouth.      tadalafil (CIALIS) 20 MG tablet Take 1 tablet (20 mg total) by mouth daily as needed for erectile dysfunction. 30 tablet 11      Allergies  Allergen Reactions   Pecan Nut (Diagnostic) Other (See Comments)    PECAN AND WALNUT NUTS   Penicillin V Potassium Swelling     Past Medical History:  Diagnosis Date    Diabetes mellitus without complication (HCC)    ED (erectile dysfunction)    GERD (gastroesophageal reflux disease)    Hyperlipidemia    Hypertension    Impingement syndrome of shoulder, left    Irritable bowel disease    Lichen simplex    Numbness     Review of systems:  Otherwise negative.    Physical Exam  Gen: Alert, oriented. Appears stated age.  HEENT: PERRLA. Lungs: No respiratory distress CV: RRR Abd: soft, benign, no masses Ext: No edema    Planned procedures: Proceed with colonoscopy. The patient understands the nature of the planned procedure, indications, risks, alternatives and potential complications including but not limited to bleeding, infection, perforation, damage to internal organs and possible oversedation/side effects from anesthesia. The patient agrees and gives consent to proceed.  Please refer to procedure notes for findings, recommendations and patient disposition/instructions.     Regis Bill, MD [facility]

## 2022-09-08 NOTE — Anesthesia Postprocedure Evaluation (Signed)
Anesthesia Post Note  Patient: Curtis Farmer  Procedure(s) Performed: COLONOSCOPY WITH PROPOFOL  Patient location during evaluation: PACU Anesthesia Type: General Level of consciousness: awake and alert Pain management: satisfactory to patient Vital Signs Assessment: post-procedure vital signs reviewed and stable Respiratory status: spontaneous breathing and nonlabored ventilation Cardiovascular status: stable Anesthetic complications: no   There were no known notable events for this encounter.   Last Vitals:  Vitals:   09/08/22 0854 09/08/22 1011  BP: 121/76 (!) 104/59  Pulse: 86   Resp: 16   Temp: (!) 35.8 C (!) 36.1 C  SpO2: 97%     Last Pain:  Vitals:   09/08/22 1011  TempSrc: Temporal  PainSc: Asleep                 VAN STAVEREN,

## 2022-09-08 NOTE — Anesthesia Preprocedure Evaluation (Signed)
Anesthesia Evaluation  Patient identified by MRN, date of birth, ID band Patient awake    Reviewed: Allergy & Precautions, NPO status , Patient's Chart, lab work & pertinent test results  Airway Mallampati: II  TM Distance: >3 FB Neck ROM: full    Dental  (+) Teeth Intact   Pulmonary neg pulmonary ROS, Patient abstained from smoking., former smoker   Pulmonary exam normal breath sounds clear to auscultation       Cardiovascular Exercise Tolerance: Good hypertension, Pt. on medications negative cardio ROS Normal cardiovascular exam Rhythm:Regular     Neuro/Psych negative neurological ROS  negative psych ROS   GI/Hepatic negative GI ROS, Neg liver ROS,GERD  Medicated,,  Endo/Other  negative endocrine ROSdiabetes, Type 2, Oral Hypoglycemic Agents    Renal/GU negative Renal ROS  negative genitourinary   Musculoskeletal  (+) Arthritis ,    Abdominal  (+) + obese  Peds negative pediatric ROS (+)  Hematology negative hematology ROS (+)   Anesthesia Other Findings Past Medical History: No date: Diabetes mellitus without complication (HCC) No date: ED (erectile dysfunction) No date: GERD (gastroesophageal reflux disease) No date: Hyperlipidemia No date: Hypertension No date: Impingement syndrome of shoulder, left No date: Irritable bowel disease No date: Lichen simplex No date: Numbness  Past Surgical History: No date: APPENDECTOMY No date: BACK SURGERY No date: CHOLECYSTECTOMY No date: COLONOSCOPY 01/11/2016: COLONOSCOPY WITH PROPOFOL; N/A     Comment:  Procedure: COLONOSCOPY WITH PROPOFOL;  Surgeon: Christena Deem, MD;  Location: [facility] ENDOSCOPY;  Service:               Endoscopy;  Laterality: N/A; 03/01/2021: COLONOSCOPY WITH PROPOFOL; N/A     Comment:  Procedure: COLONOSCOPY WITH PROPOFOL;  Surgeon:               Regis Bill, MD;  Location: [facility] ENDOSCOPY;                Service:  Endoscopy;  Laterality: N/A;  DM 03/01/2021: ESOPHAGOGASTRODUODENOSCOPY (EGD) WITH PROPOFOL; N/A     Comment:  Procedure: ESOPHAGOGASTRODUODENOSCOPY (EGD) WITH               PROPOFOL;  Surgeon: Regis Bill, MD;  Location:               [facility] ENDOSCOPY;  Service: Endoscopy;  Laterality: N/A;     Reproductive/Obstetrics negative OB ROS                             Anesthesia Physical Anesthesia Plan  ASA: 3  Anesthesia Plan: General   Post-op Pain Management:    Induction: Intravenous  PONV Risk Score and Plan: Propofol infusion and TIVA  Airway Management Planned: Natural Airway  Additional Equipment:   Intra-op Plan:   Post-operative Plan:   Informed Consent: I have reviewed the patients History and Physical, chart, labs and discussed the procedure including the risks, benefits and alternatives for the proposed anesthesia with the patient or authorized representative who has indicated his/her understanding and acceptance.     Dental Advisory Given  Plan Discussed with: CRNA and Surgeon  Anesthesia Plan Comments:        Anesthesia Quick Evaluation

## 2022-09-08 NOTE — Transfer of Care (Signed)
Immediate Anesthesia Transfer of Care Note  Patient: Curtis Farmer  Procedure(s) Performed: COLONOSCOPY WITH PROPOFOL  Patient Location: PACU  Anesthesia Type:General  Level of Consciousness: drowsy  Airway & Oxygen Therapy: Patient Spontanous Breathing  Post-op Assessment: Report given to RN and Post -op Vital signs reviewed and stable  Post vital signs: stable  Last Vitals:  Vitals Value Taken Time  BP 104/59 09/08/22 1011  Temp 36.1 C 09/08/22 1011  Pulse 81 09/08/22 1012  Resp 21 09/08/22 1012  SpO2 98 % 09/08/22 1012  Vitals shown include unfiled device data.  Last Pain:  Vitals:   09/08/22 1011  TempSrc: Temporal  PainSc: Asleep         Complications: No notable events documented.

## 2022-09-08 NOTE — Op Note (Signed)
[facility] Patient Name: Curtis Farmer Procedure Date: 09/08/2022 9:41 AM MRN: [id number] Account #: [id number] Date of Birth: December 01, 1950 Admit Type: Outpatient Age: 72 Room: [facility] ENDO ROOM 3 Gender: Male Note Status: Finalized Instrument Name: Prentice Docker [id number] Procedure:             Colonoscopy Indications:           Surveillance: History of adenomatous polyps,                         inadequate prep on last exam (<82yr) Providers:             Eather Colas MD, MD Referring MD:          Marina Goodell (Referring MD) Medicines:             Monitored Anesthesia Care Complications:         No immediate complications. Procedure:             Pre-Anesthesia Assessment:                        - Prior to the procedure, a History and Physical was                         performed, and patient medications and allergies were                         reviewed. The patient is competent. The risks and                         benefits of the procedure and the sedation options and                         risks were discussed with the patient. All questions                         were answered and informed consent was obtained.                         Patient identification and proposed procedure were                         verified by the physician, the nurse, the                         anesthesiologist, the anesthetist and the technician                         in the endoscopy suite. Mental Status Examination:                         alert and oriented. Airway Examination: normal                         oropharyngeal airway and neck mobility. Respiratory                         Examination: clear to auscultation. CV Examination:  normal. Prophylactic Antibiotics: The patient does not                         require prophylactic antibiotics. Prior                         Anticoagulants: The patient has taken no anticoagulant                          or antiplatelet agents. ASA Grade Assessment: III - A                         patient with severe systemic disease. After reviewing                         the risks and benefits, the patient was deemed in                         satisfactory condition to undergo the procedure. The                         anesthesia plan was to use monitored anesthesia care                         (MAC). Immediately prior to administration of                         medications, the patient was re-assessed for adequacy                         to receive sedatives. The heart rate, respiratory                         rate, oxygen saturations, blood pressure, adequacy of                         pulmonary ventilation, and response to care were                         monitored throughout the procedure. The physical                         status of the patient was re-assessed after the                         procedure.                        After obtaining informed consent, the colonoscope was                         passed under direct vision. Throughout the procedure,                         the patient's blood pressure, pulse, and oxygen                         saturations were monitored continuously. The  Colonoscope was introduced through the anus and                         advanced to the the cecum, identified by appendiceal                         orifice and ileocecal valve. The colonoscopy was                         performed without difficulty. The patient tolerated                         the procedure well. The quality of the bowel                         preparation was good. The ileocecal valve, appendiceal                         orifice, and rectum were photographed. Findings:      The perianal and digital rectal examinations were normal.      A few small-mouthed diverticula were found in the sigmoid colon and       descending colon.       Internal hemorrhoids were found during retroflexion. The hemorrhoids       were Grade I (internal hemorrhoids that do not prolapse).      The exam was otherwise without abnormality on direct and retroflexion       views. Impression:            - Diverticulosis in the sigmoid colon and in the                         descending colon.                        - Internal hemorrhoids.                        - The examination was otherwise normal on direct and                         retroflexion views.                        - No specimens collected. Recommendation:        - Discharge patient to home.                        - Resume previous diet.                        - Continue present medications.                        - Repeat colonoscopy in 10 years for surveillance.                        - Return to referring physician as previously                         scheduled. Procedure Code(s):     --- Professional ---  G0105, Colorectal cancer screening; colonoscopy on                         individual at high risk Diagnosis Code(s):     --- Professional ---                        Z86.010, Personal history of colonic polyps                        K64.0, First degree hemorrhoids                        K57.30, Diverticulosis of large intestine without                         perforation or abscess without bleeding CPT copyright 2022 [employer]. All rights reserved. The codes documented in this report are preliminary and upon coder review may  be revised to meet current compliance requirements. Eather Colas MD, MD 09/08/2022 10:12:51 AM Number of Addenda: 0 Note Initiated On: 09/08/2022 9:41 AM Scope Withdrawal Time: 0 hours 9 minutes 7 seconds  Total Procedure Duration: 0 hours 13 minutes 8 seconds  Estimated Blood Loss:  Estimated blood loss: none.      [facility]

## 2022-09-27 ENCOUNTER — Encounter: Payer: Self-pay | Admitting: Urology

## 2022-09-27 ENCOUNTER — Other Ambulatory Visit: Payer: Medicare PPO | Admitting: Urology

## 2022-09-27 VITALS — BP 127/72 | HR 70 | Ht 76.0 in | Wt 235.0 lb

## 2022-09-27 DIAGNOSIS — R972 Elevated prostate specific antigen [PSA]: Secondary | ICD-10-CM

## 2022-09-27 DIAGNOSIS — C61 Malignant neoplasm of prostate: Secondary | ICD-10-CM

## 2022-09-27 DIAGNOSIS — Z2989 Encounter for other specified prophylactic measures: Secondary | ICD-10-CM | POA: Diagnosis not present

## 2022-09-27 MED ORDER — GENTAMICIN SULFATE 40 MG/ML IJ SOLN
80.0000 mg | Freq: Once | INTRAMUSCULAR | Status: AC
Start: 2022-09-27 — End: 2022-09-27
  Administered 2022-09-27: 80 mg via INTRAMUSCULAR

## 2022-09-27 MED ORDER — LEVOFLOXACIN 500 MG PO TABS
500.0000 mg | ORAL_TABLET | Freq: Once | ORAL | Status: AC
Start: 2022-09-27 — End: 2022-09-27
  Administered 2022-09-27: 500 mg via ORAL

## 2022-09-27 NOTE — Progress Notes (Signed)
   09/27/22  Indication: Elevated PSA, 12.1, abnormal prostate MRI.  History of negative biopsy February 2020 for PSA of 6.9.  MRI Fusion Prostate Biopsy Procedure   Informed consent was obtained, and we discussed the risks of bleeding and infection/sepsis. A time out was performed to ensure correct patient identity.  Pre-Procedure: - Last PSA Level: 12.1 - Gentamicin and levaquin given for antibiotic prophylaxis - Prostate measured 38 g on MRI, PSA density 0.32 - No significant hypoechoic or median lobe noted  Procedure: - Prostate block performed using 10 cc 1% lidocaine  - MRI fusion biopsy was performed, and 3 biopsies were taken from the ROI#1 PIRADS 3 lesion located left anterior transition zone - Standard biopsies taken from sextant areas, 12 under ultrasound guidance. - Total of 15 cores taken  Post-Procedure: - Patient tolerated the procedure well - He was counseled to seek immediate medical attention if experiences significant bleeding, fevers, or severe pain - Return in one week to discuss biopsy results  Assessment/ Plan: Will follow up in 1-2 weeks to discuss pathology  Legrand Rams, MD 09/27/2022

## 2022-09-27 NOTE — Patient Instructions (Signed)

## 2022-10-04 ENCOUNTER — Ambulatory Visit: Payer: Medicare PPO | Admitting: Urology

## 2022-10-11 ENCOUNTER — Ambulatory Visit: Payer: Medicare PPO | Admitting: Urology

## 2022-10-11 ENCOUNTER — Encounter: Payer: Self-pay | Admitting: Urology

## 2022-10-11 VITALS — BP 137/68 | HR 80 | Ht 75.0 in | Wt 238.0 lb

## 2022-10-11 DIAGNOSIS — C61 Malignant neoplasm of prostate: Secondary | ICD-10-CM | POA: Diagnosis not present

## 2022-10-11 NOTE — Patient Instructions (Signed)
The Benefits of a Plant-Based Diet for Urology Health  A plant-based diet emphasizes the consumption of whole, unprocessed plant foods while minimizing or excluding animal products including meat and dairy products. This dietary approach has gained attention for its potential to promote overall health, including urology-related conditions. Incorporating a plant-based diet into your lifestyle can offer numerous benefits for maintaining optimal urology health.  1. Reduced Risk of Kidney Stones: A plant-based diet is typically rich in fruits, vegetables, legumes, and whole grains. These foods are high in dietary fiber, potassium, and magnesium, which can help reduce the risk of developing kidney stones. Be careful to avoid high quantities of spinach, as these can contribute to kidney stone formation if eaten in large volumes. The increased intake of water-soluble fiber can enhance the excretion of waste products and prevent the crystallization of minerals that lead to stone formation.  2. Improved Prostate Health: Studies have suggested a link between the consumption of red and processed meats and an increased risk of prostate problems, including benign prostatic hyperplasia (BPH) and prostate cancer. By adopting a plant-based diet, you can lower your intake of saturated fats and decrease the risk of these conditions. PSA levels can often decrease on plant based diets! Plant foods are also rich in antioxidants and phytochemicals that have been associated with prostate health.  3. Better Bladder Function: A diet focused on plant-based foods can contribute to better bladder health by reducing the risk of urinary tract infections (UTIs). Berries, citrus fruits, and leafy greens are known for their high vitamin C content, which can acidify urine and create an environment less favorable for bacteria growth. Additionally, plant-based diets are generally lower in sodium, which can help prevent fluid retention and  reduce the strain on the bladder.  4. Management of Erectile Dysfunction (ED): Some research suggests that a plant-based diet can positively impact erectile function. Plant-based diets are associated with improved cardiovascular health, which is crucial for maintaining healthy blood flow and nerve function required for proper erectile function. By reducing the consumption of high-cholesterol and high-saturated fat animal products, a plant-based diet may contribute to a decreased risk of ED.  5. Prevention of Chronic Conditions: A plant-based diet can help prevent or manage chronic conditions such as obesity, diabetes, and hypertension. These conditions can contribute to urology-related issues, including urinary incontinence and kidney dysfunction. By maintaining a healthy weight and managing these conditions, you can reduce the risk of urology-related complications.  Conclusion: Embracing a plant-based diet can offer significant benefits for urology health. By incorporating a variety of colorful fruits, vegetables, whole grains, nuts, seeds, and legumes into your meals, you can support kidney health, prostate health, bladder function, and overall well-being. Remember to consult with a healthcare professional or registered dietitian before making any significant dietary changes, especially if you have existing health conditions. Your personalized approach to a plant-based diet can contribute to improved urology health and enhance your quality of life.      Prostate Cancer  The prostate is a small gland that produces fluid that makes up semen (seminal fluid). It is located below the bladder in men, in front of the rectum. Prostate cancer is the abnormal growth of cells in the prostate gland. What are the causes? The exact cause of this condition is not known. What increases the risk? You are more likely to develop this condition if: You are 44 years of age or older. You have a family history of  prostate cancer. You have a family history of  breast and ovarian cancer. You have genes that are passed from parent to child (inherited), such as BRCA1 and BRCA2. You have Lynch syndrome. African American men and men of African descent are diagnosed with prostate cancer at higher rates than other men. The reasons for this are not well understood and are likely due to a combination of genetic and environmental factors. What are the signs or symptoms? Symptoms of this condition include: Problems with urination. This may include: A weak or interrupted flow of urine. Trouble starting or stopping urination. Trouble emptying the bladder all the way. The need to urinate more often, especially at night. Blood in urine or semen. Persistent pain or discomfort in the lower back, lower abdomen, or hips. Trouble getting an erection. Weakness or numbness in the legs or feet. How is this diagnosed? This condition can be diagnosed with: A digital rectal exam. For this exam, a health care provider inserts a gloved finger into the rectum to feel the prostate gland. A blood test called a prostate-specific antigen (PSA) test. A procedure in which a sample of tissue is taken from the prostate and checked under a microscope (prostate biopsy). An imaging test called transrectal ultrasonography. Once the condition is diagnosed, tests will be done to determine how far the cancer has spread. This is called staging the cancer. Staging may involve imaging tests, such as a bone scan, CT scan, PET scan, or MRI. Stages of prostate cancer The stages of prostate cancer are as follows: Stage 1 (I). At this stage, the cancer is found in the prostate only. The cancer is not visible on imaging tests, and it is usually found by accident, such as during prostate surgery. Stage 2 (II). At this stage, the cancer is more advanced than it is in stage 1, but the cancer has not spread outside the prostate. Stage 3 (III). At this  stage, the cancer has spread beyond the outer layer of the prostate to nearby tissues. The cancer may be found in the seminal vesicles, which are near the bladder and the prostate. Stage 4 (IV). At this stage, the cancer has spread to other parts of the body, such as the lymph nodes, bones, bladder, rectum, liver, or lungs. Prostate cancer grading Prostate cancer is also graded according to how the cancer cells look under a microscope. This is called the Gleason score and the total score can range from 6-10, indicating how likely it is that the cancer will spread (metastasize) to other parts of the body. The higher the score, the greater the likelihood that the cancer will spread. Gleason 6 or lower: This indicates that the cancer cells look similar to normal prostate cells (well differentiated). Gleason 7: This indicates that the cancer cells look somewhat similar to normal prostate cells (moderately differentiated). Gleason 8, 9, or 10: This indicates that the cancer cells look very different than normal prostate cells (poorly differentiated). How is this treated? Treatment for this condition depends on several factors, including the stage of the cancer, your age, personal preferences, and your overall health. Talk with your health care provider about treatment options that are recommended for you. Common treatments include: Observation for early stage prostate cancer (active surveillance). This involves having exams, blood tests, and in some cases, more biopsies. For some men, this is the only treatment needed. Surgery. Types of surgeries include:  A robotic radical prostatectomy. This is laparoscopic surgery to remove the prostate and lymph nodes with the help of robotic arms that are controlled  by the surgeon.  Radiation treatment. Types of radiation treatment include: External beam radiation. This type aims beams of radiation from outside the body at the prostate to destroy cancerous  cells. Brachytherapy. This type uses radioactive needles, seeds, wires, or tubes that are implanted into the prostate gland. Like external beam radiation, brachytherapy destroys cancerous cells. An advantage is that this type of radiation limits the damage to surrounding tissue and has fewer side effects. Chemotherapy. This treatment kills cancer cells or stops them from multiplying. It kills both cancer cells and normal cells. Targeted therapy. This treatment uses medicines to kill cancer cells without damaging normal cells. Hormone treatment. This treatment involves taking medicines that act on testosterone, one of the male hormones, by: Stopping your body from producing testosterone. Blocking testosterone from reaching cancer cells. Follow these instructions at home: Lifestyle Do not use any products that contain nicotine or tobacco. These products include cigarettes, chewing tobacco, and vaping devices, such as e-cigarettes. If you need help quitting, ask your health care provider. Eat a healthy diet. To do this: Eat foods that are high in fiber. These include beans, whole grains, and fresh fruits and vegetables. Limit foods that are high in fat and sugar. These include fried or sweet foods. Treatment for prostate cancer may affect sexual function. If you have a partner, continue to have intimate moments. This may include touching, holding, hugging, and caressing your partner. Get plenty of sleep. Consider joining a support group for men who have prostate cancer. Meeting with a support group may help you learn to manage the stress of having cancer. General instructions Take over-the-counter and prescription medicines only as told by your health care provider. If you have to go to the hospital, notify your cancer specialist (oncologist). Keep all follow-up visits. This is important. Where to find more information American Cancer Society: www.cancer.org American Society of Clinical Oncology:  www.cancer.net [employer]: www.cancer.gov Contact a health care provider if: You have new or increasing trouble urinating. You have new or increasing blood in your urine. You have new or increasing pain in your hips, back, or chest. Get help right away if: You have weakness or numbness in your legs. You cannot control urination or your bowel movements (incontinence). You have chills or a fever. Summary The prostate is a small gland that is involved in the production of semen. It is located below a man's bladder, in front of the rectum. Prostate cancer is the abnormal growth of cells in the prostate gland. Treatment for this condition depends on the stage of the cancer, your age, personal preferences, and your overall health. Talk with your health care provider about treatment options that are recommended for you. Consider joining a support group for men who have prostate cancer. Meeting with a support group may help you learn to manage the stress of having cancer. This information is not intended to replace advice given to you by your health care provider. Make sure you discuss any questions you have with your health care provider. Document Revised: 05/12/2020 Document Reviewed: 05/12/2020 Elsevier Patient Education  2024 [employer].

## 2022-10-11 NOTE — Progress Notes (Signed)
   10/11/2022 3:56 PM   Curtis Farmer 01/13/1951 [id number]  Reason for visit: Discuss prostate biopsy results  HPI: 72 year old male with long history of elevated and variable PSAs, negative prostate biopsy in February 2020 for PSA of 6.9.  Prostate MRI in October 2022 for a PSA of 9.4 showed a volume of 38 g, PI-RADS 3 lesion in the left transition zone.  PSA varied afterwards, but has continued to increase over the last year, most recently 12.1, and he opted for an MRI fusion biopsy.  He has ED at baseline that is responsive to 20 mg Cialis on demand.  He underwent an MRI fusion biopsy on 09/27/2022, the PI-RADS 3 ROI#1 showed only benign tissue, and there was a single core of Gleason score 3+4=7 at the left base with 6% max core involvement.  We had a lengthy conversation today about the patient's new diagnosis of prostate cancer.  We reviewed the risk classifications per the AUA guidelines including very low risk, low risk, intermediate risk, and high risk disease, and the need for additional staging imaging with CT and bone scan in patients with unfavorable intermediate risk and high risk disease.  I explained that his life expectancy, clinical stage, Gleason score, PSA, and other co-morbidities influence treatment strategies.  We discussed the roles of active surveillance, radiation therapy, surgical therapy with robotic prostatectomy, and hormone therapy with androgen deprivation.  We discussed that patients urinary symptoms also impact treatment strategy, as patients with severe lower urinary tract symptoms may have significant worsening or even develop urinary retention after undergoing radiation.In regards to surgery, we discussed robotic prostatectomy +/- lymphadenectomy at length.  The procedure takes 3 to 4 hours, and patient's typically discharge home on post-op day #1.  A Foley catheter is left in place for 7 to 10 days to allow for healing of the vesicourethral anastomosis.  There is  a small risk of bleeding, infection, damage to surrounding structures or bowel, hernia, DVT/PE, or serious cardiac or pulmonary complications.  We discussed at length post-op side effects including erectile dysfunction, and the importance of pre-operative erectile function on long-term outcomes.  Even with a nerve sparing approach, there is an approximately 25% rate of permanent erectile dysfunction.  We also discussed postop urinary incontinence at length.  We expect patients to have stress incontinence post-operatively that will improve over period of weeks to months.  Less than 10% of men will require a pad at 1 year after surgery.  Patients will need to avoid heavy lifting and strenuous activity for 3 to 4 weeks, but most men return to their baseline activity status by 6 weeks.  72 year old male with single core of favorable intermediate risk disease with 6% core involvement.  He is unsure how he would like to proceed.  I placed a referral to radiation oncology for further discussion of options.  He is leaning towards active surveillance.  I recommended close follow-up with me with a PSA in 3 months, with low threshold to pursue definitive treatment if PSA continues to rise.  It would also be reasonable to pursue definitive treatment with radiation or surgery if he desires  I spent 45 total minutes on the day of the encounter including pre-visit review of the medical record, face-to-face time with the patient, and post visit ordering of labs/imaging/tests.   Sondra Come, MD  [facility] [address], [address] [place], [place] [address] [phone]

## 2022-10-24 ENCOUNTER — Encounter: Payer: Self-pay | Admitting: Radiation Oncology

## 2022-10-24 ENCOUNTER — Ambulatory Visit
Admission: RE | Admit: 2022-10-24 | Discharge: 2022-10-24 | Disposition: A | Discharge status: Home / Self Care | Payer: Medicare PPO | Source: Ambulatory Visit | Attending: Radiation Oncology | Admitting: Radiation Oncology

## 2022-10-24 VITALS — BP 127/78 | HR 66 | Temp 97.9°F | Resp 16 | Ht 76.0 in | Wt 241.3 lb

## 2022-10-24 DIAGNOSIS — K219 Gastro-esophageal reflux disease without esophagitis: Secondary | ICD-10-CM | POA: Diagnosis not present

## 2022-10-24 DIAGNOSIS — Z87891 Personal history of nicotine dependence: Secondary | ICD-10-CM | POA: Diagnosis not present

## 2022-10-24 DIAGNOSIS — N529 Male erectile dysfunction, unspecified: Secondary | ICD-10-CM | POA: Insufficient documentation

## 2022-10-24 DIAGNOSIS — Z79899 Other long term (current) drug therapy: Secondary | ICD-10-CM | POA: Diagnosis not present

## 2022-10-24 DIAGNOSIS — Z7984 Long term (current) use of oral hypoglycemic drugs: Secondary | ICD-10-CM | POA: Insufficient documentation

## 2022-10-24 DIAGNOSIS — C61 Malignant neoplasm of prostate: Secondary | ICD-10-CM | POA: Diagnosis not present

## 2022-10-24 DIAGNOSIS — E785 Hyperlipidemia, unspecified: Secondary | ICD-10-CM | POA: Insufficient documentation

## 2022-10-24 DIAGNOSIS — Z191 Hormone sensitive malignancy status: Secondary | ICD-10-CM | POA: Diagnosis not present

## 2022-10-24 DIAGNOSIS — E119 Type 2 diabetes mellitus without complications: Secondary | ICD-10-CM | POA: Diagnosis not present

## 2022-10-24 NOTE — Consult Note (Signed)
NEW PATIENT EVALUATION  Name: Curtis Farmer  MRN: [id number]  Date:   10/24/2022     DOB: 04/30/1950   This 72 y.o. 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  REFERRING PHYSICIAN: Marina Goodell, MD  CHIEF COMPLAINT:  Chief Complaint  Patient presents with   Prostate Cancer    consult    DIAGNOSIS: The encounter diagnosis was Malignant neoplasm of prostate (HCC).   PREVIOUS INVESTIGATIONS:  MRI scan reviewed PSMA PET scan ordered Pathology reports reviewed Clinical notes reviewed  HPI: Patient is a 72 year old male who has a long history of elevated PSA.  His most recent PSA has climbed to 12.1 up from 6.9 back in February 2020.  His last MRI scan was back in 22 showing a region of ill-defined tissue in the anterior left transitional zone with restricted diffusion.  He recently had biopsy showing 1 core out of 12 positive for Gleason 7 (3+4) adenocarcinoma.  Patient is asymptomatic specifically denies any increased lower urinary tract symptoms diarrhea fatigue or bone pain.  He is now referred to radiation collagen for consideration of treatment.  PLANNED TREATMENT REGIMEN: Possible image guided IMRT radiation therapy  PAST MEDICAL HISTORY:  has a past medical history of Diabetes mellitus without complication (HCC), ED (erectile dysfunction), GERD (gastroesophageal reflux disease), Hyperlipidemia, Hypertension, Impingement syndrome of shoulder, left, Irritable bowel disease, Lichen simplex, and Numbness.    PAST SURGICAL HISTORY:  Past Surgical History:  Procedure Laterality Date   APPENDECTOMY     BACK SURGERY     CHOLECYSTECTOMY     COLONOSCOPY     COLONOSCOPY WITH PROPOFOL N/A 01/11/2016   Procedure: COLONOSCOPY WITH PROPOFOL;  Surgeon: Christena Deem, MD;  Location: [facility] ENDOSCOPY;  Service: Endoscopy;  Laterality: N/A;   COLONOSCOPY WITH PROPOFOL N/A 03/01/2021    Procedure: COLONOSCOPY WITH PROPOFOL;  Surgeon: Regis Bill, MD;  Location: [facility] ENDOSCOPY;  Service: Endoscopy;  Laterality: N/A;  DM   COLONOSCOPY WITH PROPOFOL N/A 09/08/2022   Procedure: COLONOSCOPY WITH PROPOFOL;  Surgeon: Regis Bill, MD;  Location: [facility] ENDOSCOPY;  Service: Endoscopy;  Laterality: N/A;   ESOPHAGOGASTRODUODENOSCOPY (EGD) WITH PROPOFOL N/A 03/01/2021   Procedure: ESOPHAGOGASTRODUODENOSCOPY (EGD) WITH PROPOFOL;  Surgeon: Regis Bill, MD;  Location: [facility] ENDOSCOPY;  Service: Endoscopy;  Laterality: N/A;    FAMILY HISTORY: family history is not on file.  SOCIAL HISTORY:  reports that he has quit smoking. He has been exposed to tobacco smoke. He has never used smokeless tobacco. He reports that he does not drink alcohol and does not use drugs.  ALLERGIES: Pecan nut (diagnostic) and Penicillin v potassium  MEDICATIONS:  Current Outpatient Medications  Medication Sig Dispense Refill   albuterol (PROVENTIL HFA;VENTOLIN HFA) 108 (90 Base) MCG/ACT inhaler Inhale 2 puffs into the lungs every 6 (six) hours as needed for wheezing or shortness of breath.     colestipol (COLESTID) 1 g tablet Take 1 g by mouth 2 (two) times daily.     esomeprazole (NEXIUM) 20 MG capsule Take by mouth.     lisinopril (PRINIVIL,ZESTRIL) 20 MG tablet Take 20 mg by mouth daily.     metFORMIN (GLUCOPHAGE) 1000 MG tablet Take 1,000 mg by mouth 2 (two) times daily with a meal.     pioglitazone (ACTOS) 45 MG tablet Take 45 mg by mouth daily.     Semaglutide,0.25 or 0.5MG /DOS, (OZEMPIC, 0.25 OR  0.5 MG/DOSE,) 2 MG/1.5ML SOPN Inject into the skin.     simvastatin (ZOCOR) 20 MG tablet Take 20 mg by mouth daily.     tadalafil (CIALIS) 20 MG tablet Take 1 tablet (20 mg total) by mouth daily as needed for erectile dysfunction. 30 tablet 11   traZODone (DESYREL) 50 MG tablet Take 50 mg by mouth at bedtime.     No current facility-administered medications for this encounter.    ECOG  PERFORMANCE STATUS:  0 - Asymptomatic  REVIEW OF SYSTEMS: Patient denies any weight loss, fatigue, weakness, fever, chills or night sweats. Patient denies any loss of vision, blurred vision. Patient denies any ringing  of the ears or hearing loss. No irregular heartbeat. Patient denies heart murmur or history of fainting. Patient denies any chest pain or pain radiating to her upper extremities. Patient denies any shortness of breath, difficulty breathing at night, cough or hemoptysis. Patient denies any swelling in the lower legs. Patient denies any nausea vomiting, vomiting of blood, or coffee ground material in the vomitus. Patient denies any stomach pain. Patient states has had normal bowel movements no significant constipation or diarrhea. Patient denies any dysuria, hematuria or significant nocturia. Patient denies any problems walking, swelling in the joints or loss of balance. Patient denies any skin changes, loss of hair or loss of weight. Patient denies any excessive worrying or anxiety or significant depression. Patient denies any problems with insomnia. Patient denies excessive thirst, polyuria, polydipsia. Patient denies any swollen glands, patient denies easy bruising or easy bleeding. Patient denies any recent infections, allergies or URI. Patient "s visual fields have not changed significantly in recent time.   PHYSICAL EXAM: BP 127/78   Pulse 66   Temp 97.9 F (36.6 C)   Resp 16   Ht 6\' 4"  (1.93 m)   Wt 241 lb 4.8 oz (109.5 kg)   BMI 29.37 kg/m  Well-developed well-nourished patient in NAD. HEENT reveals PERLA, EOMI, discs not visualized.  Oral cavity is clear. No oral mucosal lesions are identified. Neck is clear without evidence of cervical or supraclavicular adenopathy. Lungs are clear to A&P. Cardiac examination is essentially unremarkable with regular rate and rhythm without murmur rub or thrill. Abdomen is benign with no organomegaly or masses noted. Motor sensory and DTR levels  are equal and symmetric in the upper and lower extremities. Cranial nerves II through XII are grossly intact. Proprioception is intact. No peripheral adenopathy or edema is identified. No motor or sensory levels are noted. Crude visual fields are within normal range.  LABORATORY DATA: Pathology reports reviewed    RADIOLOGY RESULTS: MRI scan reviewed PSMA PET scan ordered   IMPRESSION: Possible early stage adenocarcinoma the prostate presenting with a PSA of 45 and 72 year old male  PLAN: At this time like to get a better handle on extent and stage of his disease.  Have ordered a PSMA PET scan.  Depending on those results should this show only localized disease would offer IMRT image guided radiation therapy.  Would plan on delivering [address] to his prostate.  Based on my current data he has very low risk of trans capsular extension or lymph node involvement.  Based on his PSA over 12 PSMA also would alleviate any doubt about bone metastasis.  I will reconsult with the patient after his PSMA PET scan is complete.  Patient and wife both comprehend my recommendations well.  I would like to take this opportunity to thank you for allowing me to participate in  the care of your patient.Carmina Miller, MD

## 2022-11-01 ENCOUNTER — Ambulatory Visit
Admission: RE | Admit: 2022-11-01 | Discharge: 2022-11-01 | Disposition: A | Discharge status: Home / Self Care | Payer: Medicare PPO | Source: Ambulatory Visit | Attending: Radiation Oncology | Admitting: Radiation Oncology

## 2022-11-01 DIAGNOSIS — C61 Malignant neoplasm of prostate: Secondary | ICD-10-CM | POA: Diagnosis not present

## 2022-11-01 MED ORDER — FLOTUFOLASTAT F 18 GALLIUM 296-5846 MBQ/ML IV SOLN
8.0000 | Freq: Once | INTRAVENOUS | Status: AC
  Administered 2022-11-01: 8.53 via INTRAVENOUS
  Filled 2022-11-01: qty 8

## 2022-11-08 ENCOUNTER — Encounter: Payer: Self-pay | Admitting: Radiation Oncology

## 2022-11-08 ENCOUNTER — Ambulatory Visit
Admission: RE | Admit: 2022-11-08 | Discharge: 2022-11-08 | Disposition: A | Discharge status: Home / Self Care | Payer: Medicare PPO | Source: Ambulatory Visit | Attending: Radiation Oncology | Admitting: Radiation Oncology

## 2022-11-08 VITALS — BP 132/65 | HR 59 | Temp 97.4°F | Resp 16 | Wt 240.0 lb

## 2022-11-08 DIAGNOSIS — C61 Malignant neoplasm of prostate: Secondary | ICD-10-CM | POA: Diagnosis not present

## 2022-11-08 DIAGNOSIS — Z191 Hormone sensitive malignancy status: Secondary | ICD-10-CM | POA: Diagnosis not present

## 2022-11-08 NOTE — Progress Notes (Signed)
Radiation Oncology Follow up Note  Name: Curtis Farmer   Date:   11/08/2022 MRN:  [id number] DOB: 08/10/1950    This 71 y.o. 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  REFERRING PROVIDER: Marina Goodell, MD  HPI: Patient is a 72 year old male originally consulted back in August when he presented with stage IIb Gleason 7 (3+4) adenocarcinoma prostate with a PSA of 12.  He had a PSMA PET scan performed which has not been formally read although I have reviewed it..  In my interpretation there is no evidence of extracapsular spread pelvic lymphadenopathy bone metastasis.  I would stick with the additional staging of 2B.  Patient also has a low risk of pelvic lymph node involvement and we will concentrate our treatment on his prostate.  COMPLICATIONS OF TREATMENT: none  FOLLOW UP COMPLIANCE: keeps appointments   PHYSICAL EXAM:  BP 132/65 (BP Location: Left Arm, Patient Position: Sitting)   Pulse (!) 59   Temp (!) 97.4 F (36.3 C)   Resp 16   Wt 240 lb (108.9 kg)   BMI 29.21 kg/m  Well-developed well-nourished patient in NAD. HEENT reveals PERLA, EOMI, discs not visualized.  Oral cavity is clear. No oral mucosal lesions are identified. Neck is clear without evidence of cervical or supraclavicular adenopathy. Lungs are clear to A&P. Cardiac examination is essentially unremarkable with regular rate and rhythm without murmur rub or thrill. Abdomen is benign with no organomegaly or masses noted. Motor sensory and DTR levels are equal and symmetric in the upper and lower extremities. Cranial nerves II through XII are grossly intact. Proprioception is intact. No peripheral adenopathy or edema is identified. No motor or sensory levels are noted. Crude visual fields are within normal range.  RADIOLOGY RESULTS: PSMA PET scan reviewed compatible with above-stated  findings  PLAN: At this time I have recommended and ordered image guided IMRT radiation therapy.  Of asked the patient to return to urology for placement of fiducial markers as well as 44-month Eligard depot.  Risks and benefits of treatment including increased lower Neri tract symptoms possible diarrhea fatigue alteration blood counts skin reaction all were reviewed with the patient and his wife.  Once markers have been placed we will schedule him for simulation.  I would like to take this opportunity to thank you for allowing me to participate in the care of your patient.Carmina Miller, MD

## 2022-11-14 ENCOUNTER — Telehealth: Payer: Self-pay | Admitting: Urology

## 2022-11-14 NOTE — Telephone Encounter (Signed)
Called pt, he states that he has resolved this issue and no longer has questions.

## 2022-11-14 NOTE — Telephone Encounter (Signed)
Patient dropped in office this morning to ask about his marker placement and eligard injection. He is currently scheduled for 11/30/22 with Dr. Richardo Hanks. He is very concerned because he stated that Dr. Kipp Laurence office said he needed to have this done asap. He is worried about waiting to have this done, and would like to speak with medical assistant. I let him know that Dr. Richardo Hanks was out of the office this week.

## 2022-11-20 ENCOUNTER — Telehealth: Payer: Self-pay

## 2022-11-20 NOTE — Telephone Encounter (Signed)
No PA required for Eligard.

## 2022-11-20 NOTE — Telephone Encounter (Signed)
-----   Message from Nurse Cala Bradford D sent at 11/08/2022  9:52 AM EDT ----- Regarding: Markers and Eligard Good Morning,  This patient will need to have Markers placed and an Eligard injection.   Thanks, Ricki Rodriguez

## 2022-11-21 ENCOUNTER — Other Ambulatory Visit: Payer: Self-pay | Admitting: Radiation Oncology

## 2022-11-21 DIAGNOSIS — C9100 Acute lymphoblastic leukemia not having achieved remission: Secondary | ICD-10-CM

## 2022-11-28 DIAGNOSIS — Z191 Hormone sensitive malignancy status: Secondary | ICD-10-CM | POA: Diagnosis not present

## 2022-11-28 DIAGNOSIS — C61 Malignant neoplasm of prostate: Secondary | ICD-10-CM | POA: Diagnosis not present

## 2022-11-30 ENCOUNTER — Ambulatory Visit: Payer: Medicare PPO | Admitting: Urology

## 2022-11-30 ENCOUNTER — Encounter: Payer: Self-pay | Admitting: Urology

## 2022-11-30 VITALS — BP 131/75 | HR 67 | Ht 75.0 in | Wt 238.0 lb

## 2022-11-30 DIAGNOSIS — C61 Malignant neoplasm of prostate: Secondary | ICD-10-CM

## 2022-11-30 DIAGNOSIS — Z2989 Encounter for other specified prophylactic measures: Secondary | ICD-10-CM | POA: Diagnosis not present

## 2022-11-30 MED ORDER — GENTAMICIN SULFATE 40 MG/ML IJ SOLN
80.0000 mg | Freq: Once | INTRAMUSCULAR | Status: AC
Start: 2022-11-30 — End: 2022-11-30
  Administered 2022-11-30: 80 mg via INTRAMUSCULAR

## 2022-11-30 MED ORDER — LEVOFLOXACIN 500 MG PO TABS
500.0000 mg | ORAL_TABLET | Freq: Once | ORAL | Status: AC
Start: 2022-11-30 — End: 2022-11-30
  Administered 2022-11-30: 500 mg via ORAL

## 2022-11-30 MED ORDER — LEUPROLIDE ACETATE (6 MONTH) 45 MG ~~LOC~~ KIT
45.0000 mg | PACK | Freq: Once | SUBCUTANEOUS | Status: AC
Start: 2022-11-30 — End: 2022-11-30
  Administered 2022-11-30: 45 mg via SUBCUTANEOUS

## 2022-11-30 NOTE — Progress Notes (Signed)
   11/30/22  Indication: Favorable intermediate risk prostate cancer  Gold seed marker placement procedure   Informed consent was obtained, and we discussed the risks of bleeding and infection/sepsis. A time out was performed to ensure correct patient identity.  Pre-Procedure: - Gentamicin and levaquin given for antibiotic prophylaxis  Procedure: -Under ultrasound guidance, a total of 3 markers were placed, 2 at the base bilaterally, and 1 at the apex centrally  Post-Procedure: - Patient tolerated the procedure well - He was counseled to seek immediate medical attention if experiences significant bleeding, fevers, or severe pain  Assessment/ Plan: Follow-up with radiation oncology for external beam radiation One-time dose of 34-month ADT given today  Legrand Rams, MD 11/30/2022

## 2022-11-30 NOTE — Patient Instructions (Signed)

## 2022-11-30 NOTE — Progress Notes (Signed)
Eligard SubQ Injection   Due to Prostate Cancer patient is present today for a Eligard Injection.  Medication: Eligard 6 month Dose: 45 mg  Location: right  Lot: [id number] Exp: 01/2024  Patient tolerated well, no complications were noted.  Performed by: Debbe Bales, CMA  Per Dr. Richardo Hanks patient is to receive one time dose of Eligard. PA approval dates: No PA required

## 2022-12-05 ENCOUNTER — Ambulatory Visit
Admission: RE | Admit: 2022-12-05 | Discharge: 2022-12-05 | Disposition: A | Discharge status: Home / Self Care | Payer: Medicare PPO | Source: Ambulatory Visit | Attending: Radiation Oncology | Admitting: Radiation Oncology

## 2022-12-05 DIAGNOSIS — C9100 Acute lymphoblastic leukemia not having achieved remission: Secondary | ICD-10-CM

## 2022-12-05 DIAGNOSIS — Z191 Hormone sensitive malignancy status: Secondary | ICD-10-CM | POA: Diagnosis not present

## 2022-12-05 DIAGNOSIS — C61 Malignant neoplasm of prostate: Secondary | ICD-10-CM | POA: Diagnosis not present

## 2022-12-08 ENCOUNTER — Other Ambulatory Visit: Payer: Self-pay | Admitting: *Deleted

## 2022-12-08 DIAGNOSIS — C61 Malignant neoplasm of prostate: Secondary | ICD-10-CM

## 2022-12-11 DIAGNOSIS — Z191 Hormone sensitive malignancy status: Secondary | ICD-10-CM | POA: Diagnosis not present

## 2022-12-11 DIAGNOSIS — C61 Malignant neoplasm of prostate: Secondary | ICD-10-CM | POA: Diagnosis not present

## 2022-12-14 ENCOUNTER — Ambulatory Visit: Admission: RE | Admit: 2022-12-14 | Payer: Medicare PPO | Source: Ambulatory Visit

## 2022-12-18 ENCOUNTER — Ambulatory Visit
Admission: RE | Admit: 2022-12-18 | Discharge: 2022-12-18 | Disposition: A | Discharge status: Home / Self Care | Payer: Medicare PPO | Source: Ambulatory Visit | Attending: Radiation Oncology | Admitting: Radiation Oncology

## 2022-12-18 ENCOUNTER — Other Ambulatory Visit: Payer: Self-pay

## 2022-12-18 DIAGNOSIS — Z191 Hormone sensitive malignancy status: Secondary | ICD-10-CM | POA: Diagnosis not present

## 2022-12-18 DIAGNOSIS — Z510 Encounter for antineoplastic radiation therapy: Secondary | ICD-10-CM | POA: Diagnosis not present

## 2022-12-18 DIAGNOSIS — C61 Malignant neoplasm of prostate: Secondary | ICD-10-CM | POA: Diagnosis not present

## 2022-12-18 LAB — RAD ONC ARIA SESSION SUMMARY
Course Elapsed Days: 0
Plan Fractions Treated to Date: 1
Plan Prescribed Dose Per Fraction: 2.5 Gy
Plan Total Fractions Prescribed: 28
Plan Total Prescribed Dose: 70 Gy
Reference Point Dosage Given to Date: 2.5 Gy
Reference Point Session Dosage Given: 2.5 Gy
Session Number: 1

## 2022-12-19 ENCOUNTER — Ambulatory Visit
Admission: RE | Admit: 2022-12-19 | Discharge: 2022-12-19 | Disposition: A | Discharge status: Home / Self Care | Payer: Medicare PPO | Source: Ambulatory Visit | Attending: Radiation Oncology | Admitting: Radiation Oncology

## 2022-12-19 ENCOUNTER — Other Ambulatory Visit: Payer: Self-pay

## 2022-12-19 DIAGNOSIS — Z510 Encounter for antineoplastic radiation therapy: Secondary | ICD-10-CM | POA: Diagnosis not present

## 2022-12-19 DIAGNOSIS — Z191 Hormone sensitive malignancy status: Secondary | ICD-10-CM | POA: Diagnosis not present

## 2022-12-19 DIAGNOSIS — C61 Malignant neoplasm of prostate: Secondary | ICD-10-CM | POA: Diagnosis not present

## 2022-12-19 LAB — RAD ONC ARIA SESSION SUMMARY
Course Elapsed Days: 1
Plan Fractions Treated to Date: 2
Plan Prescribed Dose Per Fraction: 2.5 Gy
Plan Total Fractions Prescribed: 28
Plan Total Prescribed Dose: 70 Gy
Reference Point Dosage Given to Date: 5 Gy
Reference Point Session Dosage Given: 2.5 Gy
Session Number: 2

## 2022-12-20 ENCOUNTER — Ambulatory Visit
Admission: RE | Admit: 2022-12-20 | Discharge: 2022-12-20 | Disposition: A | Discharge status: Home / Self Care | Payer: Medicare PPO | Source: Ambulatory Visit | Attending: Radiation Oncology | Admitting: Radiation Oncology

## 2022-12-20 ENCOUNTER — Other Ambulatory Visit: Payer: Self-pay

## 2022-12-20 DIAGNOSIS — C61 Malignant neoplasm of prostate: Secondary | ICD-10-CM | POA: Diagnosis not present

## 2022-12-20 DIAGNOSIS — Z191 Hormone sensitive malignancy status: Secondary | ICD-10-CM | POA: Diagnosis not present

## 2022-12-20 DIAGNOSIS — Z510 Encounter for antineoplastic radiation therapy: Secondary | ICD-10-CM | POA: Diagnosis not present

## 2022-12-20 LAB — RAD ONC ARIA SESSION SUMMARY
Course Elapsed Days: 2
Plan Fractions Treated to Date: 3
Plan Prescribed Dose Per Fraction: 2.5 Gy
Plan Total Fractions Prescribed: 28
Plan Total Prescribed Dose: 70 Gy
Reference Point Dosage Given to Date: 7.5 Gy
Reference Point Session Dosage Given: 2.5 Gy
Session Number: 3

## 2022-12-21 ENCOUNTER — Other Ambulatory Visit: Payer: Self-pay

## 2022-12-21 ENCOUNTER — Inpatient Hospital Stay: Payer: Medicare PPO

## 2022-12-21 ENCOUNTER — Ambulatory Visit
Admission: RE | Admit: 2022-12-21 | Discharge: 2022-12-21 | Disposition: A | Discharge status: Home / Self Care | Payer: Medicare PPO | Source: Ambulatory Visit | Attending: Radiation Oncology | Admitting: Radiation Oncology

## 2022-12-21 DIAGNOSIS — Z510 Encounter for antineoplastic radiation therapy: Secondary | ICD-10-CM | POA: Diagnosis not present

## 2022-12-21 DIAGNOSIS — C61 Malignant neoplasm of prostate: Secondary | ICD-10-CM | POA: Insufficient documentation

## 2022-12-21 DIAGNOSIS — Z79899 Other long term (current) drug therapy: Secondary | ICD-10-CM | POA: Insufficient documentation

## 2022-12-21 DIAGNOSIS — Z191 Hormone sensitive malignancy status: Secondary | ICD-10-CM | POA: Diagnosis not present

## 2022-12-21 LAB — RAD ONC ARIA SESSION SUMMARY
Course Elapsed Days: 3
Plan Fractions Treated to Date: 4
Plan Prescribed Dose Per Fraction: 2.5 Gy
Plan Total Fractions Prescribed: 28
Plan Total Prescribed Dose: 70 Gy
Reference Point Dosage Given to Date: 10 Gy
Reference Point Session Dosage Given: 2.5 Gy
Session Number: 4

## 2022-12-21 LAB — CBC (CANCER CENTER ONLY)
HCT: 44.4 % (ref 39.0–52.0)
Hemoglobin: 15.1 g/dL (ref 13.0–17.0)
MCH: 31.3 pg (ref 26.0–34.0)
MCHC: 34 g/dL (ref 30.0–36.0)
MCV: 91.9 fL (ref 80.0–100.0)
Platelet Count: 192 10*3/uL (ref 150–400)
RBC: 4.83 MIL/uL (ref 4.22–5.81)
RDW: 12.3 % (ref 11.5–15.5)
WBC Count: 8.6 10*3/uL (ref 4.0–10.5)
nRBC: 0 % (ref 0.0–0.2)

## 2022-12-22 ENCOUNTER — Other Ambulatory Visit: Payer: Self-pay

## 2022-12-22 ENCOUNTER — Ambulatory Visit
Admission: RE | Admit: 2022-12-22 | Discharge: 2022-12-22 | Disposition: A | Discharge status: Home / Self Care | Payer: Medicare PPO | Source: Ambulatory Visit | Attending: Radiation Oncology | Admitting: Radiation Oncology

## 2022-12-22 DIAGNOSIS — Z191 Hormone sensitive malignancy status: Secondary | ICD-10-CM | POA: Diagnosis not present

## 2022-12-22 DIAGNOSIS — Z510 Encounter for antineoplastic radiation therapy: Secondary | ICD-10-CM | POA: Diagnosis not present

## 2022-12-22 DIAGNOSIS — C61 Malignant neoplasm of prostate: Secondary | ICD-10-CM | POA: Diagnosis not present

## 2022-12-22 LAB — RAD ONC ARIA SESSION SUMMARY
Course Elapsed Days: 4
Plan Fractions Treated to Date: 5
Plan Prescribed Dose Per Fraction: 2.5 Gy
Plan Total Fractions Prescribed: 28
Plan Total Prescribed Dose: 70 Gy
Reference Point Dosage Given to Date: 12.5 Gy
Reference Point Session Dosage Given: 2.5 Gy
Session Number: 5

## 2022-12-25 ENCOUNTER — Other Ambulatory Visit: Payer: Self-pay

## 2022-12-25 ENCOUNTER — Ambulatory Visit
Admission: RE | Admit: 2022-12-25 | Discharge: 2022-12-25 | Disposition: A | Discharge status: Home / Self Care | Payer: Medicare PPO | Source: Ambulatory Visit | Attending: Radiation Oncology | Admitting: Radiation Oncology

## 2022-12-25 DIAGNOSIS — Z510 Encounter for antineoplastic radiation therapy: Secondary | ICD-10-CM | POA: Diagnosis not present

## 2022-12-25 DIAGNOSIS — C61 Malignant neoplasm of prostate: Secondary | ICD-10-CM | POA: Diagnosis not present

## 2022-12-25 DIAGNOSIS — Z191 Hormone sensitive malignancy status: Secondary | ICD-10-CM | POA: Diagnosis not present

## 2022-12-25 LAB — RAD ONC ARIA SESSION SUMMARY
Course Elapsed Days: 7
Plan Fractions Treated to Date: 6
Plan Prescribed Dose Per Fraction: 2.5 Gy
Plan Total Fractions Prescribed: 28
Plan Total Prescribed Dose: 70 Gy
Reference Point Dosage Given to Date: 15 Gy
Reference Point Session Dosage Given: 2.5 Gy
Session Number: 6

## 2022-12-26 ENCOUNTER — Other Ambulatory Visit: Payer: Self-pay

## 2022-12-26 ENCOUNTER — Ambulatory Visit
Admission: RE | Admit: 2022-12-26 | Discharge: 2022-12-26 | Disposition: A | Discharge status: Home / Self Care | Payer: Medicare PPO | Source: Ambulatory Visit | Attending: Radiation Oncology | Admitting: Radiation Oncology

## 2022-12-26 DIAGNOSIS — Z191 Hormone sensitive malignancy status: Secondary | ICD-10-CM | POA: Diagnosis not present

## 2022-12-26 DIAGNOSIS — C61 Malignant neoplasm of prostate: Secondary | ICD-10-CM | POA: Diagnosis not present

## 2022-12-26 DIAGNOSIS — Z510 Encounter for antineoplastic radiation therapy: Secondary | ICD-10-CM | POA: Diagnosis not present

## 2022-12-26 LAB — RAD ONC ARIA SESSION SUMMARY
Course Elapsed Days: 8
Plan Fractions Treated to Date: 7
Plan Prescribed Dose Per Fraction: 2.5 Gy
Plan Total Fractions Prescribed: 28
Plan Total Prescribed Dose: 70 Gy
Reference Point Dosage Given to Date: 17.5 Gy
Reference Point Session Dosage Given: 2.5 Gy
Session Number: 7

## 2022-12-27 ENCOUNTER — Ambulatory Visit
Admission: RE | Admit: 2022-12-27 | Discharge: 2022-12-27 | Disposition: A | Discharge status: Home / Self Care | Payer: Medicare PPO | Source: Ambulatory Visit | Attending: Radiation Oncology | Admitting: Radiation Oncology

## 2022-12-27 ENCOUNTER — Other Ambulatory Visit: Payer: Self-pay

## 2022-12-27 DIAGNOSIS — C61 Malignant neoplasm of prostate: Secondary | ICD-10-CM | POA: Diagnosis not present

## 2022-12-27 DIAGNOSIS — Z191 Hormone sensitive malignancy status: Secondary | ICD-10-CM | POA: Diagnosis not present

## 2022-12-27 DIAGNOSIS — Z510 Encounter for antineoplastic radiation therapy: Secondary | ICD-10-CM | POA: Diagnosis not present

## 2022-12-27 LAB — RAD ONC ARIA SESSION SUMMARY
Course Elapsed Days: 9
Plan Fractions Treated to Date: 8
Plan Prescribed Dose Per Fraction: 2.5 Gy
Plan Total Fractions Prescribed: 28
Plan Total Prescribed Dose: 70 Gy
Reference Point Dosage Given to Date: 20 Gy
Reference Point Session Dosage Given: 2.5 Gy
Session Number: 8

## 2022-12-28 ENCOUNTER — Other Ambulatory Visit: Payer: Self-pay

## 2022-12-28 ENCOUNTER — Ambulatory Visit
Admission: RE | Admit: 2022-12-28 | Discharge: 2022-12-28 | Disposition: A | Discharge status: Home / Self Care | Payer: Medicare PPO | Source: Ambulatory Visit | Attending: Radiation Oncology | Admitting: Radiation Oncology

## 2022-12-28 DIAGNOSIS — C61 Malignant neoplasm of prostate: Secondary | ICD-10-CM | POA: Diagnosis not present

## 2022-12-28 DIAGNOSIS — Z510 Encounter for antineoplastic radiation therapy: Secondary | ICD-10-CM | POA: Diagnosis not present

## 2022-12-28 DIAGNOSIS — Z191 Hormone sensitive malignancy status: Secondary | ICD-10-CM | POA: Diagnosis not present

## 2022-12-28 LAB — RAD ONC ARIA SESSION SUMMARY
Course Elapsed Days: 10
Plan Fractions Treated to Date: 9
Plan Prescribed Dose Per Fraction: 2.5 Gy
Plan Total Fractions Prescribed: 28
Plan Total Prescribed Dose: 70 Gy
Reference Point Dosage Given to Date: 22.5 Gy
Reference Point Session Dosage Given: 2.5 Gy
Session Number: 9

## 2022-12-29 ENCOUNTER — Ambulatory Visit
Admission: RE | Admit: 2022-12-29 | Discharge: 2022-12-29 | Disposition: A | Discharge status: Home / Self Care | Payer: Medicare PPO | Source: Ambulatory Visit | Attending: Radiation Oncology | Admitting: Radiation Oncology

## 2022-12-29 ENCOUNTER — Other Ambulatory Visit: Payer: Self-pay

## 2022-12-29 DIAGNOSIS — C61 Malignant neoplasm of prostate: Secondary | ICD-10-CM | POA: Diagnosis not present

## 2022-12-29 DIAGNOSIS — Z510 Encounter for antineoplastic radiation therapy: Secondary | ICD-10-CM | POA: Diagnosis not present

## 2022-12-29 DIAGNOSIS — Z191 Hormone sensitive malignancy status: Secondary | ICD-10-CM | POA: Diagnosis not present

## 2022-12-29 LAB — RAD ONC ARIA SESSION SUMMARY
Course Elapsed Days: 11
Plan Fractions Treated to Date: 10
Plan Prescribed Dose Per Fraction: 2.5 Gy
Plan Total Fractions Prescribed: 28
Plan Total Prescribed Dose: 70 Gy
Reference Point Dosage Given to Date: 25 Gy
Reference Point Session Dosage Given: 2.5 Gy
Session Number: 10

## 2023-01-01 ENCOUNTER — Ambulatory Visit
Admission: RE | Admit: 2023-01-01 | Discharge: 2023-01-01 | Disposition: A | Discharge status: Home / Self Care | Payer: Medicare PPO | Source: Ambulatory Visit | Attending: Radiation Oncology | Admitting: Radiation Oncology

## 2023-01-01 ENCOUNTER — Other Ambulatory Visit: Payer: Self-pay

## 2023-01-01 DIAGNOSIS — Z510 Encounter for antineoplastic radiation therapy: Secondary | ICD-10-CM | POA: Diagnosis not present

## 2023-01-01 DIAGNOSIS — Z191 Hormone sensitive malignancy status: Secondary | ICD-10-CM | POA: Diagnosis not present

## 2023-01-01 DIAGNOSIS — C61 Malignant neoplasm of prostate: Secondary | ICD-10-CM | POA: Diagnosis not present

## 2023-01-01 DIAGNOSIS — E119 Type 2 diabetes mellitus without complications: Secondary | ICD-10-CM | POA: Diagnosis not present

## 2023-01-01 DIAGNOSIS — E7800 Pure hypercholesterolemia, unspecified: Secondary | ICD-10-CM | POA: Diagnosis not present

## 2023-01-01 LAB — RAD ONC ARIA SESSION SUMMARY
Course Elapsed Days: 14
Plan Fractions Treated to Date: 11
Plan Prescribed Dose Per Fraction: 2.5 Gy
Plan Total Fractions Prescribed: 28
Plan Total Prescribed Dose: 70 Gy
Reference Point Dosage Given to Date: 27.5 Gy
Reference Point Session Dosage Given: 2.5 Gy
Session Number: 11

## 2023-01-02 ENCOUNTER — Other Ambulatory Visit: Payer: Self-pay

## 2023-01-02 ENCOUNTER — Ambulatory Visit
Admission: RE | Admit: 2023-01-02 | Discharge: 2023-01-02 | Disposition: A | Discharge status: Home / Self Care | Payer: Medicare PPO | Source: Ambulatory Visit | Attending: Radiation Oncology | Admitting: Radiation Oncology

## 2023-01-02 DIAGNOSIS — Z510 Encounter for antineoplastic radiation therapy: Secondary | ICD-10-CM | POA: Diagnosis not present

## 2023-01-02 DIAGNOSIS — Z191 Hormone sensitive malignancy status: Secondary | ICD-10-CM | POA: Diagnosis not present

## 2023-01-02 DIAGNOSIS — C61 Malignant neoplasm of prostate: Secondary | ICD-10-CM | POA: Diagnosis not present

## 2023-01-02 LAB — RAD ONC ARIA SESSION SUMMARY
Course Elapsed Days: 15
Plan Fractions Treated to Date: 12
Plan Prescribed Dose Per Fraction: 2.5 Gy
Plan Total Fractions Prescribed: 28
Plan Total Prescribed Dose: 70 Gy
Reference Point Dosage Given to Date: 30 Gy
Reference Point Session Dosage Given: 2.5 Gy
Session Number: 12

## 2023-01-03 ENCOUNTER — Other Ambulatory Visit: Payer: Self-pay

## 2023-01-03 ENCOUNTER — Ambulatory Visit
Admission: RE | Admit: 2023-01-03 | Discharge: 2023-01-03 | Disposition: A | Discharge status: Home / Self Care | Payer: Medicare PPO | Source: Ambulatory Visit | Attending: Radiation Oncology | Admitting: Radiation Oncology

## 2023-01-03 DIAGNOSIS — Z510 Encounter for antineoplastic radiation therapy: Secondary | ICD-10-CM | POA: Diagnosis not present

## 2023-01-03 DIAGNOSIS — Z191 Hormone sensitive malignancy status: Secondary | ICD-10-CM | POA: Diagnosis not present

## 2023-01-03 DIAGNOSIS — C61 Malignant neoplasm of prostate: Secondary | ICD-10-CM | POA: Diagnosis not present

## 2023-01-03 LAB — RAD ONC ARIA SESSION SUMMARY
Course Elapsed Days: 16
Plan Fractions Treated to Date: 13
Plan Prescribed Dose Per Fraction: 2.5 Gy
Plan Total Fractions Prescribed: 28
Plan Total Prescribed Dose: 70 Gy
Reference Point Dosage Given to Date: 32.5 Gy
Reference Point Session Dosage Given: 2.5 Gy
Session Number: 13

## 2023-01-04 ENCOUNTER — Ambulatory Visit
Admission: RE | Admit: 2023-01-04 | Discharge: 2023-01-04 | Disposition: A | Discharge status: Home / Self Care | Payer: Medicare PPO | Source: Ambulatory Visit | Attending: Radiation Oncology | Admitting: Radiation Oncology

## 2023-01-04 ENCOUNTER — Inpatient Hospital Stay: Payer: Medicare PPO

## 2023-01-04 ENCOUNTER — Other Ambulatory Visit: Payer: Self-pay

## 2023-01-04 DIAGNOSIS — Z79899 Other long term (current) drug therapy: Secondary | ICD-10-CM | POA: Insufficient documentation

## 2023-01-04 DIAGNOSIS — Z191 Hormone sensitive malignancy status: Secondary | ICD-10-CM | POA: Diagnosis not present

## 2023-01-04 DIAGNOSIS — C61 Malignant neoplasm of prostate: Secondary | ICD-10-CM | POA: Insufficient documentation

## 2023-01-04 DIAGNOSIS — Z510 Encounter for antineoplastic radiation therapy: Secondary | ICD-10-CM | POA: Diagnosis not present

## 2023-01-04 LAB — RAD ONC ARIA SESSION SUMMARY
Course Elapsed Days: 17
Plan Fractions Treated to Date: 14
Plan Prescribed Dose Per Fraction: 2.5 Gy
Plan Total Fractions Prescribed: 28
Plan Total Prescribed Dose: 70 Gy
Reference Point Dosage Given to Date: 35 Gy
Reference Point Session Dosage Given: 2.5 Gy
Session Number: 14

## 2023-01-04 LAB — CBC (CANCER CENTER ONLY)
HCT: 42.7 % (ref 39.0–52.0)
Hemoglobin: 14.5 g/dL (ref 13.0–17.0)
MCH: 31.2 pg (ref 26.0–34.0)
MCHC: 34 g/dL (ref 30.0–36.0)
MCV: 91.8 fL (ref 80.0–100.0)
Platelet Count: 208 10*3/uL (ref 150–400)
RBC: 4.65 MIL/uL (ref 4.22–5.81)
RDW: 12.2 % (ref 11.5–15.5)
WBC Count: 6.4 10*3/uL (ref 4.0–10.5)
nRBC: 0 % (ref 0.0–0.2)

## 2023-01-05 ENCOUNTER — Other Ambulatory Visit: Payer: Self-pay

## 2023-01-05 ENCOUNTER — Ambulatory Visit
Admission: RE | Admit: 2023-01-05 | Discharge: 2023-01-05 | Disposition: A | Discharge status: Home / Self Care | Payer: Medicare PPO | Source: Ambulatory Visit | Attending: Radiation Oncology | Admitting: Radiation Oncology

## 2023-01-05 DIAGNOSIS — Z510 Encounter for antineoplastic radiation therapy: Secondary | ICD-10-CM | POA: Diagnosis not present

## 2023-01-05 DIAGNOSIS — Z191 Hormone sensitive malignancy status: Secondary | ICD-10-CM | POA: Diagnosis not present

## 2023-01-05 DIAGNOSIS — C61 Malignant neoplasm of prostate: Secondary | ICD-10-CM | POA: Diagnosis not present

## 2023-01-05 LAB — RAD ONC ARIA SESSION SUMMARY
Course Elapsed Days: 18
Plan Fractions Treated to Date: 15
Plan Prescribed Dose Per Fraction: 2.5 Gy
Plan Total Fractions Prescribed: 28
Plan Total Prescribed Dose: 70 Gy
Reference Point Dosage Given to Date: 37.5 Gy
Reference Point Session Dosage Given: 2.5 Gy
Session Number: 15

## 2023-01-08 ENCOUNTER — Ambulatory Visit
Admission: RE | Admit: 2023-01-08 | Discharge: 2023-01-08 | Disposition: A | Discharge status: Home / Self Care | Payer: Medicare PPO | Source: Ambulatory Visit | Attending: Radiation Oncology | Admitting: Radiation Oncology

## 2023-01-08 ENCOUNTER — Other Ambulatory Visit: Payer: Self-pay

## 2023-01-08 DIAGNOSIS — Z191 Hormone sensitive malignancy status: Secondary | ICD-10-CM | POA: Diagnosis not present

## 2023-01-08 DIAGNOSIS — Z23 Encounter for immunization: Secondary | ICD-10-CM | POA: Diagnosis not present

## 2023-01-08 DIAGNOSIS — E119 Type 2 diabetes mellitus without complications: Secondary | ICD-10-CM | POA: Diagnosis not present

## 2023-01-08 DIAGNOSIS — K219 Gastro-esophageal reflux disease without esophagitis: Secondary | ICD-10-CM | POA: Diagnosis not present

## 2023-01-08 DIAGNOSIS — I10 Essential (primary) hypertension: Secondary | ICD-10-CM | POA: Diagnosis not present

## 2023-01-08 DIAGNOSIS — G4700 Insomnia, unspecified: Secondary | ICD-10-CM | POA: Diagnosis not present

## 2023-01-08 DIAGNOSIS — Z0000 Encounter for general adult medical examination without abnormal findings: Secondary | ICD-10-CM | POA: Diagnosis not present

## 2023-01-08 DIAGNOSIS — E7800 Pure hypercholesterolemia, unspecified: Secondary | ICD-10-CM | POA: Diagnosis not present

## 2023-01-08 DIAGNOSIS — K580 Irritable bowel syndrome with diarrhea: Secondary | ICD-10-CM | POA: Diagnosis not present

## 2023-01-08 DIAGNOSIS — Z510 Encounter for antineoplastic radiation therapy: Secondary | ICD-10-CM | POA: Diagnosis not present

## 2023-01-08 DIAGNOSIS — Z1331 Encounter for screening for depression: Secondary | ICD-10-CM | POA: Diagnosis not present

## 2023-01-08 DIAGNOSIS — C61 Malignant neoplasm of prostate: Secondary | ICD-10-CM | POA: Diagnosis not present

## 2023-01-08 LAB — RAD ONC ARIA SESSION SUMMARY
Course Elapsed Days: 21
Plan Fractions Treated to Date: 16
Plan Prescribed Dose Per Fraction: 2.5 Gy
Plan Total Fractions Prescribed: 28
Plan Total Prescribed Dose: 70 Gy
Reference Point Dosage Given to Date: 40 Gy
Reference Point Session Dosage Given: 2.5 Gy
Session Number: 16

## 2023-01-09 ENCOUNTER — Other Ambulatory Visit: Payer: Self-pay

## 2023-01-09 ENCOUNTER — Ambulatory Visit
Admission: RE | Admit: 2023-01-09 | Discharge: 2023-01-09 | Disposition: A | Discharge status: Home / Self Care | Payer: Medicare PPO | Source: Ambulatory Visit | Attending: Radiation Oncology | Admitting: Radiation Oncology

## 2023-01-09 DIAGNOSIS — C61 Malignant neoplasm of prostate: Secondary | ICD-10-CM | POA: Diagnosis not present

## 2023-01-09 DIAGNOSIS — Z510 Encounter for antineoplastic radiation therapy: Secondary | ICD-10-CM | POA: Diagnosis not present

## 2023-01-09 DIAGNOSIS — Z191 Hormone sensitive malignancy status: Secondary | ICD-10-CM | POA: Diagnosis not present

## 2023-01-09 LAB — RAD ONC ARIA SESSION SUMMARY
Course Elapsed Days: 22
Plan Fractions Treated to Date: 17
Plan Prescribed Dose Per Fraction: 2.5 Gy
Plan Total Fractions Prescribed: 28
Plan Total Prescribed Dose: 70 Gy
Reference Point Dosage Given to Date: 42.5 Gy
Reference Point Session Dosage Given: 2.5 Gy
Session Number: 17

## 2023-01-10 ENCOUNTER — Other Ambulatory Visit: Payer: Self-pay

## 2023-01-10 ENCOUNTER — Ambulatory Visit
Admission: RE | Admit: 2023-01-10 | Discharge: 2023-01-10 | Disposition: A | Discharge status: Home / Self Care | Payer: Medicare PPO | Source: Ambulatory Visit | Attending: Radiation Oncology | Admitting: Radiation Oncology

## 2023-01-10 DIAGNOSIS — C61 Malignant neoplasm of prostate: Secondary | ICD-10-CM | POA: Diagnosis not present

## 2023-01-10 DIAGNOSIS — Z510 Encounter for antineoplastic radiation therapy: Secondary | ICD-10-CM | POA: Diagnosis not present

## 2023-01-10 DIAGNOSIS — Z191 Hormone sensitive malignancy status: Secondary | ICD-10-CM | POA: Diagnosis not present

## 2023-01-10 LAB — RAD ONC ARIA SESSION SUMMARY
Course Elapsed Days: 23
Plan Fractions Treated to Date: 18
Plan Prescribed Dose Per Fraction: 2.5 Gy
Plan Total Fractions Prescribed: 28
Plan Total Prescribed Dose: 70 Gy
Reference Point Dosage Given to Date: 45 Gy
Reference Point Session Dosage Given: 2.5 Gy
Session Number: 18

## 2023-01-11 ENCOUNTER — Other Ambulatory Visit: Payer: Self-pay

## 2023-01-11 ENCOUNTER — Ambulatory Visit
Admission: RE | Admit: 2023-01-11 | Discharge: 2023-01-11 | Disposition: A | Discharge status: Home / Self Care | Payer: Medicare PPO | Source: Ambulatory Visit | Attending: Radiation Oncology | Admitting: Radiation Oncology

## 2023-01-11 DIAGNOSIS — Z191 Hormone sensitive malignancy status: Secondary | ICD-10-CM | POA: Diagnosis not present

## 2023-01-11 DIAGNOSIS — C61 Malignant neoplasm of prostate: Secondary | ICD-10-CM | POA: Diagnosis not present

## 2023-01-11 DIAGNOSIS — Z510 Encounter for antineoplastic radiation therapy: Secondary | ICD-10-CM | POA: Diagnosis not present

## 2023-01-11 LAB — RAD ONC ARIA SESSION SUMMARY
Course Elapsed Days: 24
Plan Fractions Treated to Date: 19
Plan Prescribed Dose Per Fraction: 2.5 Gy
Plan Total Fractions Prescribed: 28
Plan Total Prescribed Dose: 70 Gy
Reference Point Dosage Given to Date: 47.5 Gy
Reference Point Session Dosage Given: 2.5 Gy
Session Number: 19

## 2023-01-12 ENCOUNTER — Ambulatory Visit
Admission: RE | Admit: 2023-01-12 | Discharge: 2023-01-12 | Disposition: A | Discharge status: Home / Self Care | Payer: Medicare PPO | Source: Ambulatory Visit | Attending: Radiation Oncology | Admitting: Radiation Oncology

## 2023-01-12 ENCOUNTER — Other Ambulatory Visit: Payer: Self-pay

## 2023-01-12 DIAGNOSIS — Z191 Hormone sensitive malignancy status: Secondary | ICD-10-CM | POA: Diagnosis not present

## 2023-01-12 DIAGNOSIS — Z510 Encounter for antineoplastic radiation therapy: Secondary | ICD-10-CM | POA: Diagnosis not present

## 2023-01-12 DIAGNOSIS — C61 Malignant neoplasm of prostate: Secondary | ICD-10-CM | POA: Diagnosis not present

## 2023-01-12 LAB — RAD ONC ARIA SESSION SUMMARY
Course Elapsed Days: 25
Plan Fractions Treated to Date: 20
Plan Prescribed Dose Per Fraction: 2.5 Gy
Plan Total Fractions Prescribed: 28
Plan Total Prescribed Dose: 70 Gy
Reference Point Dosage Given to Date: 50 Gy
Reference Point Session Dosage Given: 2.5 Gy
Session Number: 20

## 2023-01-15 ENCOUNTER — Ambulatory Visit
Admission: RE | Admit: 2023-01-15 | Discharge: 2023-01-15 | Disposition: A | Discharge status: Home / Self Care | Payer: Medicare PPO | Source: Ambulatory Visit | Attending: Radiation Oncology | Admitting: Radiation Oncology

## 2023-01-15 ENCOUNTER — Other Ambulatory Visit: Payer: Self-pay

## 2023-01-15 DIAGNOSIS — C61 Malignant neoplasm of prostate: Secondary | ICD-10-CM | POA: Diagnosis not present

## 2023-01-15 DIAGNOSIS — Z191 Hormone sensitive malignancy status: Secondary | ICD-10-CM | POA: Diagnosis not present

## 2023-01-15 DIAGNOSIS — Z510 Encounter for antineoplastic radiation therapy: Secondary | ICD-10-CM | POA: Diagnosis not present

## 2023-01-15 LAB — RAD ONC ARIA SESSION SUMMARY
Course Elapsed Days: 28
Plan Fractions Treated to Date: 21
Plan Prescribed Dose Per Fraction: 2.5 Gy
Plan Total Fractions Prescribed: 28
Plan Total Prescribed Dose: 70 Gy
Reference Point Dosage Given to Date: 52.5 Gy
Reference Point Session Dosage Given: 2.5 Gy
Session Number: 21

## 2023-01-16 ENCOUNTER — Other Ambulatory Visit: Payer: Self-pay

## 2023-01-16 ENCOUNTER — Ambulatory Visit: Payer: Medicare PPO | Admitting: Urology

## 2023-01-16 ENCOUNTER — Ambulatory Visit
Admission: RE | Admit: 2023-01-16 | Discharge: 2023-01-16 | Disposition: A | Discharge status: Home / Self Care | Payer: Medicare PPO | Source: Ambulatory Visit | Attending: Radiation Oncology | Admitting: Radiation Oncology

## 2023-01-16 ENCOUNTER — Ambulatory Visit: Payer: Medicare PPO

## 2023-01-16 DIAGNOSIS — Z191 Hormone sensitive malignancy status: Secondary | ICD-10-CM | POA: Diagnosis not present

## 2023-01-16 DIAGNOSIS — C61 Malignant neoplasm of prostate: Secondary | ICD-10-CM | POA: Diagnosis not present

## 2023-01-16 DIAGNOSIS — Z510 Encounter for antineoplastic radiation therapy: Secondary | ICD-10-CM | POA: Diagnosis not present

## 2023-01-16 LAB — RAD ONC ARIA SESSION SUMMARY
Course Elapsed Days: 29
Plan Fractions Treated to Date: 22
Plan Prescribed Dose Per Fraction: 2.5 Gy
Plan Total Fractions Prescribed: 28
Plan Total Prescribed Dose: 70 Gy
Reference Point Dosage Given to Date: 55 Gy
Reference Point Session Dosage Given: 2.5 Gy
Session Number: 22

## 2023-01-17 ENCOUNTER — Ambulatory Visit
Admission: RE | Admit: 2023-01-17 | Discharge: 2023-01-17 | Disposition: A | Discharge status: Home / Self Care | Payer: Medicare PPO | Source: Ambulatory Visit | Attending: Radiation Oncology | Admitting: Radiation Oncology

## 2023-01-17 ENCOUNTER — Other Ambulatory Visit: Payer: Self-pay

## 2023-01-17 DIAGNOSIS — C61 Malignant neoplasm of prostate: Secondary | ICD-10-CM | POA: Diagnosis not present

## 2023-01-17 DIAGNOSIS — Z510 Encounter for antineoplastic radiation therapy: Secondary | ICD-10-CM | POA: Diagnosis not present

## 2023-01-17 DIAGNOSIS — Z191 Hormone sensitive malignancy status: Secondary | ICD-10-CM | POA: Diagnosis not present

## 2023-01-17 LAB — RAD ONC ARIA SESSION SUMMARY
Course Elapsed Days: 30
Plan Fractions Treated to Date: 23
Plan Prescribed Dose Per Fraction: 2.5 Gy
Plan Total Fractions Prescribed: 28
Plan Total Prescribed Dose: 70 Gy
Reference Point Dosage Given to Date: 57.5 Gy
Reference Point Session Dosage Given: 2.5 Gy
Session Number: 23

## 2023-01-18 ENCOUNTER — Inpatient Hospital Stay: Payer: Medicare PPO

## 2023-01-18 ENCOUNTER — Other Ambulatory Visit: Payer: Self-pay

## 2023-01-18 ENCOUNTER — Ambulatory Visit
Admission: RE | Admit: 2023-01-18 | Discharge: 2023-01-18 | Disposition: A | Discharge status: Home / Self Care | Payer: Medicare PPO | Source: Ambulatory Visit | Attending: Radiation Oncology | Admitting: Radiation Oncology

## 2023-01-18 DIAGNOSIS — C61 Malignant neoplasm of prostate: Secondary | ICD-10-CM | POA: Diagnosis not present

## 2023-01-18 DIAGNOSIS — Z191 Hormone sensitive malignancy status: Secondary | ICD-10-CM | POA: Diagnosis not present

## 2023-01-18 DIAGNOSIS — Z510 Encounter for antineoplastic radiation therapy: Secondary | ICD-10-CM | POA: Diagnosis not present

## 2023-01-18 LAB — RAD ONC ARIA SESSION SUMMARY
Course Elapsed Days: 31
Plan Fractions Treated to Date: 24
Plan Prescribed Dose Per Fraction: 2.5 Gy
Plan Total Fractions Prescribed: 28
Plan Total Prescribed Dose: 70 Gy
Reference Point Dosage Given to Date: 60 Gy
Reference Point Session Dosage Given: 2.5 Gy
Session Number: 24

## 2023-01-18 LAB — CBC (CANCER CENTER ONLY)
HCT: 43.6 % (ref 39.0–52.0)
Hemoglobin: 14.7 g/dL (ref 13.0–17.0)
MCH: 30.8 pg (ref 26.0–34.0)
MCHC: 33.7 g/dL (ref 30.0–36.0)
MCV: 91.2 fL (ref 80.0–100.0)
Platelet Count: 178 10*3/uL (ref 150–400)
RBC: 4.78 MIL/uL (ref 4.22–5.81)
RDW: 12.6 % (ref 11.5–15.5)
WBC Count: 6.5 10*3/uL (ref 4.0–10.5)
nRBC: 0 % (ref 0.0–0.2)

## 2023-01-19 ENCOUNTER — Ambulatory Visit
Admission: RE | Admit: 2023-01-19 | Discharge: 2023-01-19 | Disposition: A | Discharge status: Home / Self Care | Payer: Medicare PPO | Source: Ambulatory Visit | Attending: Radiation Oncology | Admitting: Radiation Oncology

## 2023-01-19 ENCOUNTER — Other Ambulatory Visit: Payer: Self-pay

## 2023-01-19 DIAGNOSIS — C61 Malignant neoplasm of prostate: Secondary | ICD-10-CM | POA: Diagnosis not present

## 2023-01-19 DIAGNOSIS — Z191 Hormone sensitive malignancy status: Secondary | ICD-10-CM | POA: Diagnosis not present

## 2023-01-19 DIAGNOSIS — Z510 Encounter for antineoplastic radiation therapy: Secondary | ICD-10-CM | POA: Diagnosis not present

## 2023-01-19 LAB — RAD ONC ARIA SESSION SUMMARY
Course Elapsed Days: 32
Plan Fractions Treated to Date: 25
Plan Prescribed Dose Per Fraction: 2.5 Gy
Plan Total Fractions Prescribed: 28
Plan Total Prescribed Dose: 70 Gy
Reference Point Dosage Given to Date: 62.5 Gy
Reference Point Session Dosage Given: 2.5 Gy
Session Number: 25

## 2023-01-22 ENCOUNTER — Ambulatory Visit
Admission: RE | Admit: 2023-01-22 | Discharge: 2023-01-22 | Disposition: A | Discharge status: Home / Self Care | Payer: Medicare PPO | Source: Ambulatory Visit | Attending: Radiation Oncology | Admitting: Radiation Oncology

## 2023-01-22 ENCOUNTER — Other Ambulatory Visit: Payer: Self-pay

## 2023-01-22 DIAGNOSIS — Z510 Encounter for antineoplastic radiation therapy: Secondary | ICD-10-CM | POA: Diagnosis not present

## 2023-01-22 DIAGNOSIS — C61 Malignant neoplasm of prostate: Secondary | ICD-10-CM | POA: Diagnosis not present

## 2023-01-22 DIAGNOSIS — Z191 Hormone sensitive malignancy status: Secondary | ICD-10-CM | POA: Diagnosis not present

## 2023-01-22 LAB — RAD ONC ARIA SESSION SUMMARY
Course Elapsed Days: 35
Plan Fractions Treated to Date: 26
Plan Prescribed Dose Per Fraction: 2.5 Gy
Plan Total Fractions Prescribed: 28
Plan Total Prescribed Dose: 70 Gy
Reference Point Dosage Given to Date: 65 Gy
Reference Point Session Dosage Given: 2.5 Gy
Session Number: 26

## 2023-01-23 ENCOUNTER — Other Ambulatory Visit: Payer: Self-pay

## 2023-01-23 ENCOUNTER — Ambulatory Visit
Admission: RE | Admit: 2023-01-23 | Discharge: 2023-01-23 | Disposition: A | Discharge status: Home / Self Care | Payer: Medicare PPO | Source: Ambulatory Visit | Attending: Radiation Oncology | Admitting: Radiation Oncology

## 2023-01-23 DIAGNOSIS — C61 Malignant neoplasm of prostate: Secondary | ICD-10-CM | POA: Diagnosis not present

## 2023-01-23 DIAGNOSIS — Z191 Hormone sensitive malignancy status: Secondary | ICD-10-CM | POA: Diagnosis not present

## 2023-01-23 DIAGNOSIS — Z510 Encounter for antineoplastic radiation therapy: Secondary | ICD-10-CM | POA: Diagnosis not present

## 2023-01-23 LAB — RAD ONC ARIA SESSION SUMMARY
Course Elapsed Days: 36
Plan Fractions Treated to Date: 27
Plan Prescribed Dose Per Fraction: 2.5 Gy
Plan Total Fractions Prescribed: 28
Plan Total Prescribed Dose: 70 Gy
Reference Point Dosage Given to Date: 67.5 Gy
Reference Point Session Dosage Given: 2.5 Gy
Session Number: 27

## 2023-01-24 ENCOUNTER — Ambulatory Visit: Payer: Medicare PPO

## 2023-01-24 ENCOUNTER — Ambulatory Visit
Admission: RE | Admit: 2023-01-24 | Discharge: 2023-01-24 | Disposition: A | Discharge status: Home / Self Care | Payer: Medicare PPO | Source: Ambulatory Visit | Attending: Radiation Oncology | Admitting: Radiation Oncology

## 2023-01-24 ENCOUNTER — Other Ambulatory Visit: Payer: Self-pay

## 2023-01-24 DIAGNOSIS — Z510 Encounter for antineoplastic radiation therapy: Secondary | ICD-10-CM | POA: Diagnosis not present

## 2023-01-24 DIAGNOSIS — C61 Malignant neoplasm of prostate: Secondary | ICD-10-CM | POA: Diagnosis not present

## 2023-01-24 DIAGNOSIS — Z191 Hormone sensitive malignancy status: Secondary | ICD-10-CM | POA: Diagnosis not present

## 2023-01-24 LAB — RAD ONC ARIA SESSION SUMMARY
Course Elapsed Days: 37
Plan Fractions Treated to Date: 28
Plan Prescribed Dose Per Fraction: 2.5 Gy
Plan Total Fractions Prescribed: 28
Plan Total Prescribed Dose: 70 Gy
Reference Point Dosage Given to Date: 70 Gy
Reference Point Session Dosage Given: 2.5 Gy
Session Number: 28

## 2023-01-29 ENCOUNTER — Ambulatory Visit: Payer: Medicare PPO

## 2023-01-30 ENCOUNTER — Ambulatory Visit: Payer: Medicare PPO

## 2023-01-31 ENCOUNTER — Ambulatory Visit: Payer: Medicare PPO

## 2023-02-01 ENCOUNTER — Ambulatory Visit: Payer: Medicare PPO

## 2023-02-02 ENCOUNTER — Ambulatory Visit: Payer: Medicare PPO

## 2023-02-05 ENCOUNTER — Ambulatory Visit: Payer: Medicare PPO

## 2023-02-06 ENCOUNTER — Ambulatory Visit: Payer: Medicare PPO

## 2023-02-07 ENCOUNTER — Ambulatory Visit: Payer: Medicare PPO

## 2023-02-08 ENCOUNTER — Ambulatory Visit: Payer: Medicare PPO

## 2023-02-09 ENCOUNTER — Ambulatory Visit: Payer: Medicare PPO

## 2023-02-12 ENCOUNTER — Ambulatory Visit: Payer: Medicare PPO

## 2023-02-13 ENCOUNTER — Ambulatory Visit: Payer: Medicare PPO

## 2023-02-14 ENCOUNTER — Ambulatory Visit: Payer: Medicare PPO

## 2023-02-15 ENCOUNTER — Ambulatory Visit: Payer: Medicare PPO

## 2023-02-16 ENCOUNTER — Ambulatory Visit: Payer: Medicare PPO

## 2023-02-19 ENCOUNTER — Ambulatory Visit: Payer: Medicare PPO

## 2023-03-05 ENCOUNTER — Ambulatory Visit
Admission: RE | Admit: 2023-03-05 | Discharge: 2023-03-05 | Disposition: A | Discharge status: Home / Self Care | Payer: Medicare PPO | Source: Ambulatory Visit | Attending: Radiation Oncology | Admitting: Radiation Oncology

## 2023-03-05 ENCOUNTER — Encounter: Payer: Self-pay | Admitting: Radiation Oncology

## 2023-03-05 ENCOUNTER — Other Ambulatory Visit: Payer: Self-pay | Admitting: *Deleted

## 2023-03-05 VITALS — BP 116/76 | HR 88 | Temp 97.3°F | Resp 16 | Wt 246.0 lb

## 2023-03-05 DIAGNOSIS — C61 Malignant neoplasm of prostate: Secondary | ICD-10-CM | POA: Insufficient documentation

## 2023-03-05 DIAGNOSIS — Z923 Personal history of irradiation: Secondary | ICD-10-CM | POA: Diagnosis not present

## 2023-03-05 NOTE — Progress Notes (Signed)
 Radiation Oncology Follow up Note  Name: Curtis Farmer   Date:   03/05/2023 MRN:  [id number] DOB: [birth year]    This 73 y.o. male presents to the clinic today for 1 month follow-up status post image guided IMRT radiation therapy for Gleason 7 (3+4) adenocarcinoma presenting with a PSA of 12.  REFERRING PROVIDER: Jeffie Cheryl FORBES, MD  HPI: Patient is a 73 year old male now out 1 month having completed image guided IMRT radiation therapy for a Gleason 7 adenocarcinoma the prostate.  Seen today in routine follow-up he is doing well.  Specifically denies any increased lower urinary tract symptoms diarrhea or fatigue..  COMPLICATIONS OF TREATMENT: none  FOLLOW UP COMPLIANCE: keeps appointments   PHYSICAL EXAM:  BP 116/76   Pulse 88   Temp (!) 97.3 F (36.3 C)   Resp 16   Wt 246 lb (111.6 kg)   BMI 30.75 kg/m  Well-developed well-nourished patient in NAD. HEENT reveals PERLA, EOMI, discs not visualized.  Oral cavity is clear. No oral mucosal lesions are identified. Neck is clear without evidence of cervical or supraclavicular adenopathy. Lungs are clear to A&P. Cardiac examination is essentially unremarkable with regular rate and rhythm without murmur rub or thrill. Abdomen is benign with no organomegaly or masses noted. Motor sensory and DTR levels are equal and symmetric in the upper and lower extremities. Cranial nerves II through XII are grossly intact. Proprioception is intact. No peripheral adenopathy or edema is identified. No motor or sensory levels are noted. Crude visual fields are within normal range.  RADIOLOGY RESULTS: No current films to review  PLAN: At the present time patient is doing well very low side effect profile status post image guided IMRT radiation.  Of asked to see him back in 3 months with a follow-up PSA at that time.  Patient knows to call with any concerns.  I would like to take this opportunity to thank you for allowing me to participate in the care of  your patient.SABRA Marcey Penton, MD

## 2023-04-02 DIAGNOSIS — K219 Gastro-esophageal reflux disease without esophagitis: Secondary | ICD-10-CM | POA: Diagnosis not present

## 2023-04-02 DIAGNOSIS — R490 Dysphonia: Secondary | ICD-10-CM | POA: Diagnosis not present

## 2023-04-02 DIAGNOSIS — R0981 Nasal congestion: Secondary | ICD-10-CM | POA: Diagnosis not present

## 2023-05-25 ENCOUNTER — Other Ambulatory Visit: Payer: Self-pay | Admitting: *Deleted

## 2023-05-25 ENCOUNTER — Other Ambulatory Visit: Payer: Medicare PPO

## 2023-05-25 DIAGNOSIS — R972 Elevated prostate specific antigen [PSA]: Secondary | ICD-10-CM | POA: Diagnosis not present

## 2023-05-25 DIAGNOSIS — C61 Malignant neoplasm of prostate: Secondary | ICD-10-CM

## 2023-05-26 LAB — PSA: Prostate Specific Ag, Serum: <0.1 ng/mL (ref 0.0–4.0)

## 2023-05-28 ENCOUNTER — Telehealth: Payer: Self-pay | Admitting: *Deleted

## 2023-05-28 NOTE — Telephone Encounter (Signed)
 kimThe patient states that  wants to speak to staff abut labs that has been done and the ones he needs

## 2023-05-28 NOTE — Telephone Encounter (Signed)
 Spoke with patient today  regarding las problem resolved.

## 2023-05-30 ENCOUNTER — Inpatient Hospital Stay: Payer: Medicare PPO

## 2023-05-31 ENCOUNTER — Ambulatory Visit: Payer: Self-pay | Admitting: Urology

## 2023-05-31 VITALS — BP 124/80 | HR 92 | Ht 76.0 in | Wt 244.0 lb

## 2023-05-31 DIAGNOSIS — C61 Malignant neoplasm of prostate: Secondary | ICD-10-CM

## 2023-05-31 DIAGNOSIS — N529 Male erectile dysfunction, unspecified: Secondary | ICD-10-CM

## 2023-05-31 NOTE — Progress Notes (Signed)
   05/31/2023 2:29 PM   Curtis Farmer 07-Mar-1950 [id number]  Reason for visit: Follow up prostate cancer, ED  HPI: 73 year old male found to have low-volume favorable intermediate risk prostate cancer on biopsy in July 2024, pretreatment PSA was 12.1.  He underwent 1-month ADT injection on 11/30/2022 and completed XRT in November 2024.  Initial post radiation PSA 05/25/2023 was undetectable.  Reassurance provided regarding low PSA level and need to continue to monitor.  He had some dysuria and fatigue after radiation, but those symptoms have since resolved.  He really denies any complaints today.  He was using Cialis 20 mg on demand for ED previously, but has not used that recently after undergoing ADT, and his wife also has some other health problems.  RTC 6 months PSA prior   Sondra Come, MD  [facility] [address] [place], [place] [address] [phone]

## 2023-06-04 ENCOUNTER — Ambulatory Visit: Payer: Medicare PPO | Admitting: Radiation Oncology

## 2023-07-06 DIAGNOSIS — J069 Acute upper respiratory infection, unspecified: Secondary | ICD-10-CM | POA: Diagnosis not present

## 2023-07-17 DIAGNOSIS — E7800 Pure hypercholesterolemia, unspecified: Secondary | ICD-10-CM | POA: Diagnosis not present

## 2023-07-17 DIAGNOSIS — E119 Type 2 diabetes mellitus without complications: Secondary | ICD-10-CM | POA: Diagnosis not present

## 2023-07-24 DIAGNOSIS — E7800 Pure hypercholesterolemia, unspecified: Secondary | ICD-10-CM | POA: Diagnosis not present

## 2023-07-24 DIAGNOSIS — I10 Essential (primary) hypertension: Secondary | ICD-10-CM | POA: Diagnosis not present

## 2023-07-24 DIAGNOSIS — K580 Irritable bowel syndrome with diarrhea: Secondary | ICD-10-CM | POA: Diagnosis not present

## 2023-07-24 DIAGNOSIS — N529 Male erectile dysfunction, unspecified: Secondary | ICD-10-CM | POA: Diagnosis not present

## 2023-07-24 DIAGNOSIS — M2391 Unspecified internal derangement of right knee: Secondary | ICD-10-CM | POA: Diagnosis not present

## 2023-07-24 DIAGNOSIS — Z1331 Encounter for screening for depression: Secondary | ICD-10-CM | POA: Diagnosis not present

## 2023-07-24 DIAGNOSIS — R55 Syncope and collapse: Secondary | ICD-10-CM | POA: Diagnosis not present

## 2023-07-24 DIAGNOSIS — G4700 Insomnia, unspecified: Secondary | ICD-10-CM | POA: Diagnosis not present

## 2023-07-24 DIAGNOSIS — E119 Type 2 diabetes mellitus without complications: Secondary | ICD-10-CM | POA: Diagnosis not present

## 2023-07-24 DIAGNOSIS — M1711 Unilateral primary osteoarthritis, right knee: Secondary | ICD-10-CM | POA: Diagnosis not present

## 2023-07-24 DIAGNOSIS — K219 Gastro-esophageal reflux disease without esophagitis: Secondary | ICD-10-CM | POA: Diagnosis not present

## 2023-07-27 DIAGNOSIS — I4510 Unspecified right bundle-branch block: Secondary | ICD-10-CM | POA: Diagnosis not present

## 2023-07-27 DIAGNOSIS — R42 Dizziness and giddiness: Secondary | ICD-10-CM | POA: Diagnosis not present

## 2023-07-27 DIAGNOSIS — R55 Syncope and collapse: Secondary | ICD-10-CM | POA: Diagnosis not present

## 2023-08-01 DIAGNOSIS — R55 Syncope and collapse: Secondary | ICD-10-CM | POA: Diagnosis not present

## 2023-08-01 DIAGNOSIS — I4510 Unspecified right bundle-branch block: Secondary | ICD-10-CM | POA: Diagnosis not present

## 2023-08-08 DIAGNOSIS — I4510 Unspecified right bundle-branch block: Secondary | ICD-10-CM | POA: Diagnosis not present

## 2023-08-08 DIAGNOSIS — R55 Syncope and collapse: Secondary | ICD-10-CM | POA: Diagnosis not present

## 2023-08-08 DIAGNOSIS — E782 Mixed hyperlipidemia: Secondary | ICD-10-CM | POA: Diagnosis not present

## 2023-08-20 DIAGNOSIS — R55 Syncope and collapse: Secondary | ICD-10-CM | POA: Diagnosis not present

## 2023-08-21 DIAGNOSIS — S39012A Strain of muscle, fascia and tendon of lower back, initial encounter: Secondary | ICD-10-CM | POA: Diagnosis not present

## 2023-08-27 DIAGNOSIS — H43813 Vitreous degeneration, bilateral: Secondary | ICD-10-CM | POA: Diagnosis not present

## 2023-08-27 DIAGNOSIS — E11319 Type 2 diabetes mellitus with unspecified diabetic retinopathy without macular edema: Secondary | ICD-10-CM | POA: Diagnosis not present

## 2023-08-27 DIAGNOSIS — Z0100 Encounter for examination of eyes and vision without abnormal findings: Secondary | ICD-10-CM | POA: Diagnosis not present

## 2023-08-27 DIAGNOSIS — H2513 Age-related nuclear cataract, bilateral: Secondary | ICD-10-CM | POA: Diagnosis not present

## 2023-09-10 DIAGNOSIS — S39012A Strain of muscle, fascia and tendon of lower back, initial encounter: Secondary | ICD-10-CM | POA: Diagnosis not present

## 2023-09-10 DIAGNOSIS — M6281 Muscle weakness (generalized): Secondary | ICD-10-CM | POA: Diagnosis not present

## 2023-09-10 DIAGNOSIS — M2569 Stiffness of other specified joint, not elsewhere classified: Secondary | ICD-10-CM | POA: Diagnosis not present

## 2023-09-19 DIAGNOSIS — M2569 Stiffness of other specified joint, not elsewhere classified: Secondary | ICD-10-CM | POA: Diagnosis not present

## 2023-09-19 DIAGNOSIS — M6281 Muscle weakness (generalized): Secondary | ICD-10-CM | POA: Diagnosis not present

## 2023-09-19 DIAGNOSIS — S39012A Strain of muscle, fascia and tendon of lower back, initial encounter: Secondary | ICD-10-CM | POA: Diagnosis not present

## 2023-09-21 DIAGNOSIS — M6281 Muscle weakness (generalized): Secondary | ICD-10-CM | POA: Diagnosis not present

## 2023-09-21 DIAGNOSIS — M2569 Stiffness of other specified joint, not elsewhere classified: Secondary | ICD-10-CM | POA: Diagnosis not present

## 2023-09-21 DIAGNOSIS — S39012A Strain of muscle, fascia and tendon of lower back, initial encounter: Secondary | ICD-10-CM | POA: Diagnosis not present

## 2023-09-26 DIAGNOSIS — M2569 Stiffness of other specified joint, not elsewhere classified: Secondary | ICD-10-CM | POA: Diagnosis not present

## 2023-09-26 DIAGNOSIS — M6281 Muscle weakness (generalized): Secondary | ICD-10-CM | POA: Diagnosis not present

## 2023-09-26 DIAGNOSIS — S39012A Strain of muscle, fascia and tendon of lower back, initial encounter: Secondary | ICD-10-CM | POA: Diagnosis not present

## 2023-09-27 ENCOUNTER — Encounter: Payer: Self-pay | Admitting: Urology

## 2023-09-28 DIAGNOSIS — S39012A Strain of muscle, fascia and tendon of lower back, initial encounter: Secondary | ICD-10-CM | POA: Diagnosis not present

## 2023-10-11 DIAGNOSIS — R197 Diarrhea, unspecified: Secondary | ICD-10-CM | POA: Diagnosis not present

## 2023-10-23 DIAGNOSIS — E119 Type 2 diabetes mellitus without complications: Secondary | ICD-10-CM | POA: Diagnosis not present

## 2023-10-23 DIAGNOSIS — I10 Essential (primary) hypertension: Secondary | ICD-10-CM | POA: Diagnosis not present

## 2023-10-23 DIAGNOSIS — E7800 Pure hypercholesterolemia, unspecified: Secondary | ICD-10-CM | POA: Diagnosis not present

## 2023-11-05 DIAGNOSIS — E11319 Type 2 diabetes mellitus with unspecified diabetic retinopathy without macular edema: Secondary | ICD-10-CM | POA: Diagnosis not present

## 2023-11-05 DIAGNOSIS — H2513 Age-related nuclear cataract, bilateral: Secondary | ICD-10-CM | POA: Diagnosis not present

## 2023-11-06 DIAGNOSIS — M1711 Unilateral primary osteoarthritis, right knee: Secondary | ICD-10-CM | POA: Diagnosis not present

## 2023-11-06 DIAGNOSIS — M2391 Unspecified internal derangement of right knee: Secondary | ICD-10-CM | POA: Diagnosis not present

## 2023-11-28 DIAGNOSIS — G4700 Insomnia, unspecified: Secondary | ICD-10-CM | POA: Diagnosis not present

## 2023-11-28 DIAGNOSIS — R251 Tremor, unspecified: Secondary | ICD-10-CM | POA: Diagnosis not present

## 2023-11-28 DIAGNOSIS — K219 Gastro-esophageal reflux disease without esophagitis: Secondary | ICD-10-CM | POA: Diagnosis not present

## 2023-11-28 DIAGNOSIS — Z0000 Encounter for general adult medical examination without abnormal findings: Secondary | ICD-10-CM | POA: Diagnosis not present

## 2023-11-28 DIAGNOSIS — E538 Deficiency of other specified B group vitamins: Secondary | ICD-10-CM | POA: Diagnosis not present

## 2023-11-28 DIAGNOSIS — K580 Irritable bowel syndrome with diarrhea: Secondary | ICD-10-CM | POA: Diagnosis not present

## 2023-11-28 DIAGNOSIS — E119 Type 2 diabetes mellitus without complications: Secondary | ICD-10-CM | POA: Diagnosis not present

## 2023-11-28 DIAGNOSIS — R5383 Other fatigue: Secondary | ICD-10-CM | POA: Diagnosis not present

## 2023-12-03 ENCOUNTER — Other Ambulatory Visit

## 2023-12-03 DIAGNOSIS — C61 Malignant neoplasm of prostate: Secondary | ICD-10-CM | POA: Diagnosis not present

## 2023-12-04 LAB — PSA: Prostate Specific Ag, Serum: <0.1 ng/mL (ref 0.0–4.0)

## 2023-12-05 ENCOUNTER — Ambulatory Visit: Admitting: Urology

## 2023-12-05 VITALS — BP 127/77 | HR 85 | Ht 76.0 in | Wt 228.0 lb

## 2023-12-05 DIAGNOSIS — R972 Elevated prostate specific antigen [PSA]: Secondary | ICD-10-CM

## 2023-12-05 DIAGNOSIS — C61 Malignant neoplasm of prostate: Secondary | ICD-10-CM

## 2023-12-05 DIAGNOSIS — N529 Male erectile dysfunction, unspecified: Secondary | ICD-10-CM

## 2023-12-05 MED ORDER — TADALAFIL 20 MG PO TABS: 20.0000 mg | ORAL_TABLET | Freq: Every day | ORAL | 11 refills | Status: DC | PRN

## 2023-12-05 MED ORDER — TADALAFIL 20 MG PO TABS
20.0000 mg | ORAL_TABLET | Freq: Every day | ORAL | 11 refills | Status: AC | PRN
Start: 2023-12-05 — End: ?

## 2023-12-05 NOTE — Progress Notes (Signed)
   12/05/2023 1:46 PM   Curtis Farmer 01-Oct-1950 [id number]  Reason for visit: Follow up prostate cancer, ED  History: Elevated PSA of 12.1 and diagnosed with favorable intermediate risk prostate cancer, low-volume July 2024 Treated with 11-month ADT injection given October 2024, completed XRT November 2024 PSA has been undetectable  Physical Exam: BP 127/77 (BP Location: Left Arm, Patient Position: Sitting, Cuff Size: Normal)   Pulse 85   Ht 6' 4 (1.93 m)   Wt 228 lb (103.4 kg)   SpO2 100%   BMI 27.75 kg/m   Imaging/labs: PSA 12/03/2023 undetectable  Today: Doing very well, no urinary complaints Interested in resuming Cialis  for ED  Plan:   Prostate cancer: Treated with 6 months ADT and completed XRT November 2024, PSAs remained undetectable, doing well, will continue PSA monitoring ED: Previously had good results with Cialis , will resume Cialis  20 mg on demand RTC lab visit PSA 6 months, 1 year in person with PSA prior   Redell JAYSON Burnet, MD  [facility] [address] [place], [place] [address] [phone]

## 2023-12-05 NOTE — Addendum Note (Signed)
 Addended by: ELOUISE SANTA BROCKS on: 12/05/2023 02:17 PM   Modules accepted: Orders

## 2023-12-06 ENCOUNTER — Ambulatory Visit: Admitting: Urology

## 2023-12-20 DIAGNOSIS — R634 Abnormal weight loss: Secondary | ICD-10-CM | POA: Diagnosis not present

## 2023-12-20 DIAGNOSIS — K219 Gastro-esophageal reflux disease without esophagitis: Secondary | ICD-10-CM | POA: Diagnosis not present

## 2023-12-20 DIAGNOSIS — F419 Anxiety disorder, unspecified: Secondary | ICD-10-CM | POA: Diagnosis not present

## 2024-06-04 ENCOUNTER — Other Ambulatory Visit

## 2024-12-10 ENCOUNTER — Ambulatory Visit: Admitting: Urology
